# Patient Record
Sex: Female | Born: 1975
Health system: Southern US, Community
[De-identification: ages and names within clinical notes are randomized; demographics above are authoritative.]

## PROBLEM LIST (undated history)

## (undated) DIAGNOSIS — W3400XA Accidental discharge from unspecified firearms or gun, initial encounter: Secondary | ICD-10-CM

## (undated) DIAGNOSIS — M549 Dorsalgia, unspecified: Secondary | ICD-10-CM

## (undated) DIAGNOSIS — M1990 Unspecified osteoarthritis, unspecified site: Secondary | ICD-10-CM

## (undated) HISTORY — PX: CHOLECYSTECTOMY: SHX55

## (undated) HISTORY — DX: Accidental discharge from unspecified firearms or gun, initial encounter: W34.00XA

## (undated) HISTORY — DX: Dorsalgia, unspecified: M54.9

---

## 1997-03-03 ENCOUNTER — Ambulatory Visit (HOSPITAL_COMMUNITY): Admission: RE | Admit: 1997-03-03 | Discharge: 1997-03-03 | Payer: Self-pay | Admitting: *Deleted

## 1997-03-21 ENCOUNTER — Inpatient Hospital Stay (HOSPITAL_COMMUNITY): Admission: AD | Admit: 1997-03-21 | Discharge: 1997-03-21 | Payer: Self-pay | Admitting: Obstetrics

## 1997-03-23 ENCOUNTER — Inpatient Hospital Stay (HOSPITAL_COMMUNITY): Admission: AD | Admit: 1997-03-23 | Discharge: 1997-03-23 | Payer: Self-pay | Admitting: Obstetrics

## 1997-04-06 ENCOUNTER — Inpatient Hospital Stay (HOSPITAL_COMMUNITY): Admission: AD | Admit: 1997-04-06 | Discharge: 1997-04-06 | Payer: Self-pay | Admitting: Obstetrics

## 1997-05-05 ENCOUNTER — Inpatient Hospital Stay (HOSPITAL_COMMUNITY): Admission: AD | Admit: 1997-05-05 | Discharge: 1997-05-08 | Payer: Self-pay | Admitting: Obstetrics & Gynecology

## 2001-04-14 ENCOUNTER — Inpatient Hospital Stay (HOSPITAL_COMMUNITY): Admission: AD | Admit: 2001-04-14 | Discharge: 2001-04-14 | Payer: Self-pay | Admitting: *Deleted

## 2001-06-03 ENCOUNTER — Emergency Department (HOSPITAL_COMMUNITY): Admission: EM | Admit: 2001-06-03 | Discharge: 2001-06-03 | Payer: Self-pay | Admitting: *Deleted

## 2001-06-06 ENCOUNTER — Encounter: Payer: Self-pay | Admitting: Emergency Medicine

## 2001-06-06 ENCOUNTER — Emergency Department (HOSPITAL_COMMUNITY): Admission: EM | Admit: 2001-06-06 | Discharge: 2001-06-06 | Payer: Self-pay

## 2002-09-05 ENCOUNTER — Encounter: Payer: Self-pay | Admitting: Emergency Medicine

## 2002-09-05 ENCOUNTER — Emergency Department (HOSPITAL_COMMUNITY): Admission: EM | Admit: 2002-09-05 | Discharge: 2002-09-05 | Payer: Self-pay | Admitting: Emergency Medicine

## 2009-05-24 ENCOUNTER — Emergency Department (HOSPITAL_COMMUNITY): Admission: EM | Admit: 2009-05-24 | Discharge: 2009-05-24 | Payer: Self-pay | Admitting: Emergency Medicine

## 2009-10-17 ENCOUNTER — Emergency Department (HOSPITAL_COMMUNITY): Admission: EM | Admit: 2009-10-17 | Discharge: 2009-10-18 | Payer: Self-pay | Admitting: Emergency Medicine

## 2009-12-21 ENCOUNTER — Emergency Department (HOSPITAL_COMMUNITY)
Admission: EM | Admit: 2009-12-21 | Discharge: 2009-12-21 | Payer: Self-pay | Source: Home / Self Care | Admitting: Emergency Medicine

## 2010-05-29 ENCOUNTER — Emergency Department (HOSPITAL_COMMUNITY)
Admission: EM | Admit: 2010-05-29 | Discharge: 2010-05-29 | Disposition: A | Discharge status: Home / Self Care | Payer: Medicaid Other | Attending: Emergency Medicine | Admitting: Emergency Medicine

## 2010-05-29 DIAGNOSIS — M79609 Pain in unspecified limb: Secondary | ICD-10-CM | POA: Insufficient documentation

## 2010-07-31 ENCOUNTER — Ambulatory Visit (INDEPENDENT_AMBULATORY_CARE_PROVIDER_SITE_OTHER): Payer: Self-pay | Admitting: General Surgery

## 2010-09-14 ENCOUNTER — Ambulatory Visit (INDEPENDENT_AMBULATORY_CARE_PROVIDER_SITE_OTHER): Payer: Medicaid Other | Admitting: General Surgery

## 2012-06-10 ENCOUNTER — Emergency Department (HOSPITAL_COMMUNITY)
Admission: EM | Admit: 2012-06-10 | Discharge: 2012-06-10 | Disposition: A | Discharge status: Home / Self Care | Payer: No Typology Code available for payment source | Attending: Emergency Medicine | Admitting: Emergency Medicine

## 2012-06-10 ENCOUNTER — Encounter (HOSPITAL_COMMUNITY): Payer: Self-pay | Admitting: *Deleted

## 2012-06-10 DIAGNOSIS — S8990XA Unspecified injury of unspecified lower leg, initial encounter: Secondary | ICD-10-CM | POA: Insufficient documentation

## 2012-06-10 DIAGNOSIS — F17200 Nicotine dependence, unspecified, uncomplicated: Secondary | ICD-10-CM | POA: Insufficient documentation

## 2012-06-10 DIAGNOSIS — Y9389 Activity, other specified: Secondary | ICD-10-CM | POA: Insufficient documentation

## 2012-06-10 DIAGNOSIS — Y92410 Unspecified street and highway as the place of occurrence of the external cause: Secondary | ICD-10-CM | POA: Insufficient documentation

## 2012-06-10 DIAGNOSIS — S99919A Unspecified injury of unspecified ankle, initial encounter: Secondary | ICD-10-CM | POA: Insufficient documentation

## 2012-06-10 DIAGNOSIS — M25561 Pain in right knee: Secondary | ICD-10-CM

## 2012-06-10 MED ORDER — TRAMADOL HCL 50 MG PO TABS
50.0000 mg | ORAL_TABLET | Freq: Once | ORAL | Status: AC
  Administered 2012-06-10: 50 mg via ORAL
  Filled 2012-06-10: qty 1

## 2012-06-10 MED ORDER — TRAMADOL HCL 50 MG PO TABS: 50.0000 mg | ORAL_TABLET | Freq: Four times a day (QID) | ORAL | Status: DC | PRN
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
## 2012-06-10 NOTE — ED Provider Notes (Signed)
Medical screening examination/treatment/procedure(s) were performed by non-physician practitioner and as supervising physician I was immediately available for consultation/collaboration.   Jasimine Simms H Gavinn Collard, MD 06/10/12 2328 

## 2012-07-24 ENCOUNTER — Inpatient Hospital Stay (HOSPITAL_COMMUNITY)
Admission: EM | Admit: 2012-07-24 | Discharge: 2012-07-26 | DRG: 872 | Disposition: A | Discharge status: Home / Self Care | Payer: Medicaid Other | Attending: Internal Medicine | Admitting: Internal Medicine

## 2012-07-24 ENCOUNTER — Emergency Department (HOSPITAL_COMMUNITY): Payer: Medicaid Other

## 2012-07-24 ENCOUNTER — Encounter (HOSPITAL_COMMUNITY): Payer: Self-pay | Admitting: *Deleted

## 2012-07-24 DIAGNOSIS — F1210 Cannabis abuse, uncomplicated: Secondary | ICD-10-CM | POA: Diagnosis present

## 2012-07-24 DIAGNOSIS — D649 Anemia, unspecified: Secondary | ICD-10-CM | POA: Diagnosis present

## 2012-07-24 DIAGNOSIS — D72829 Elevated white blood cell count, unspecified: Secondary | ICD-10-CM | POA: Diagnosis present

## 2012-07-24 DIAGNOSIS — N12 Tubulo-interstitial nephritis, not specified as acute or chronic: Secondary | ICD-10-CM

## 2012-07-24 DIAGNOSIS — N10 Acute tubulo-interstitial nephritis: Secondary | ICD-10-CM | POA: Diagnosis present

## 2012-07-24 DIAGNOSIS — A419 Sepsis, unspecified organism: Principal | ICD-10-CM | POA: Diagnosis present

## 2012-07-24 DIAGNOSIS — E876 Hypokalemia: Secondary | ICD-10-CM | POA: Diagnosis present

## 2012-07-24 DIAGNOSIS — D509 Iron deficiency anemia, unspecified: Secondary | ICD-10-CM | POA: Diagnosis present

## 2012-07-24 DIAGNOSIS — F17200 Nicotine dependence, unspecified, uncomplicated: Secondary | ICD-10-CM | POA: Diagnosis present

## 2012-07-24 LAB — CBC WITH DIFFERENTIAL/PLATELET
Basophils Absolute: 0 10*3/uL (ref 0.0–0.1)
Basophils Absolute: 0 10*3/uL (ref 0.0–0.1)
Basophils Relative: 0 % (ref 0–1)
Eosinophils Absolute: 0.1 10*3/uL (ref 0.0–0.7)
Eosinophils Relative: 0 % (ref 0–5)
Eosinophils Relative: 1 % (ref 0–5)
HCT: 31.7 % — ABNORMAL LOW (ref 36.0–46.0)
Lymphocytes Relative: 13 % (ref 12–46)
Lymphs Abs: 2 10*3/uL (ref 0.7–4.0)
MCH: 27.2 pg (ref 26.0–34.0)
MCHC: 33.4 g/dL (ref 30.0–36.0)
MCV: 81.8 fL (ref 78.0–100.0)
MCV: 81.5 fL (ref 78.0–100.0)
Monocytes Absolute: 1.9 10*3/uL — ABNORMAL HIGH (ref 0.1–1.0)
Neutro Abs: 10.8 10*3/uL — ABNORMAL HIGH (ref 1.7–7.7)
Platelets: 273 10*3/uL (ref 150–400)
Platelets: 241 10*3/uL (ref 150–400)
RBC: 4.35 MIL/uL (ref 3.87–5.11)
RDW: 13.7 % (ref 11.5–15.5)
RDW: 13.7 % (ref 11.5–15.5)
WBC: 14.7 10*3/uL — ABNORMAL HIGH (ref 4.0–10.5)

## 2012-07-24 LAB — URINALYSIS, ROUTINE W REFLEX MICROSCOPIC
Glucose, UA: NEGATIVE mg/dL
Ketones, ur: NEGATIVE mg/dL
Nitrite: POSITIVE — AB
Specific Gravity, Urine: 1.013 (ref 1.005–1.030)
pH: 5.5 (ref 5.0–8.0)

## 2012-07-24 LAB — POCT I-STAT, CHEM 8
BUN: 5 mg/dL — ABNORMAL LOW (ref 6–23)
Calcium, Ion: 1.1 mmol/L — ABNORMAL LOW (ref 1.12–1.23)
Chloride: 103 mEq/L (ref 96–112)
HCT: 36 % (ref 36.0–46.0)
Potassium: 2.9 mEq/L — ABNORMAL LOW (ref 3.5–5.1)
Sodium: 140 mEq/L (ref 135–145)

## 2012-07-24 LAB — COMPREHENSIVE METABOLIC PANEL
ALT: 53 U/L — ABNORMAL HIGH (ref 0–35)
AST: 42 U/L — ABNORMAL HIGH (ref 0–37)
CO2: 24 mEq/L (ref 19–32)
Chloride: 103 mEq/L (ref 96–112)
GFR calc Af Amer: >90 mL/min (ref >90)
GFR calc non Af Amer: >90 mL/min (ref >90)
Glucose, Bld: 135 mg/dL — ABNORMAL HIGH (ref 70–99)
Sodium: 137 mEq/L (ref 135–145)
Total Bilirubin: 0.3 mg/dL (ref 0.3–1.2)

## 2012-07-24 LAB — URINE MICROSCOPIC-ADD ON

## 2012-07-24 LAB — POCT PREGNANCY, URINE: Preg Test, Ur: NEGATIVE

## 2012-07-24 MED ORDER — ACETAMINOPHEN 325 MG PO TABS: 650.0000 mg | ORAL_TABLET | Freq: Four times a day (QID) | ORAL | Status: DC | PRN

## 2012-07-24 MED ORDER — ONDANSETRON HCL 4 MG/2ML IJ SOLN
4.0000 mg | Freq: Once | INTRAMUSCULAR | Status: AC
  Administered 2012-07-24: 4 mg via INTRAVENOUS
  Filled 2012-07-24: qty 2

## 2012-07-24 MED ORDER — MORPHINE SULFATE 4 MG/ML IJ SOLN
4.0000 mg | Freq: Once | INTRAMUSCULAR | Status: AC
  Administered 2012-07-24: 4 mg via INTRAVENOUS
  Filled 2012-07-24: qty 1

## 2012-07-24 MED ORDER — ACETAMINOPHEN 650 MG RE SUPP: 650.0000 mg | Freq: Four times a day (QID) | RECTAL | Status: DC | PRN

## 2012-07-24 MED ORDER — SODIUM CHLORIDE 0.9 % IV SOLN
INTRAVENOUS | Status: DC
  Administered 2012-07-24 – 2012-07-26 (×4): via INTRAVENOUS

## 2012-07-24 MED ORDER — DEXTROSE 5 % IV SOLN
1.0000 g | INTRAVENOUS | Status: DC
  Administered 2012-07-25 – 2012-07-26 (×2): 1 g via INTRAVENOUS
  Filled 2012-07-24 (×2): qty 10

## 2012-07-24 MED ORDER — ONDANSETRON HCL 4 MG PO TABS: 4.0000 mg | ORAL_TABLET | Freq: Four times a day (QID) | ORAL | Status: DC | PRN

## 2012-07-24 MED ORDER — POTASSIUM CHLORIDE 10 MEQ/100ML IV SOLN
10.0000 meq | Freq: Once | INTRAVENOUS | Status: AC
  Administered 2012-07-24: 10 meq via INTRAVENOUS
  Filled 2012-07-24: qty 100

## 2012-07-24 MED ORDER — SODIUM CHLORIDE 0.9 % IV BOLUS (SEPSIS)
1000.0000 mL | Freq: Once | INTRAVENOUS | Status: AC
  Administered 2012-07-24: 1000 mL via INTRAVENOUS

## 2012-07-24 MED ORDER — POTASSIUM CHLORIDE CRYS ER 20 MEQ PO TBCR
40.0000 meq | EXTENDED_RELEASE_TABLET | Freq: Once | ORAL | Status: AC
  Administered 2012-07-24: 40 meq via ORAL
  Filled 2012-07-24: qty 2

## 2012-07-24 MED ORDER — ACETAMINOPHEN 325 MG PO TABS
650.0000 mg | ORAL_TABLET | Freq: Once | ORAL | Status: AC
  Administered 2012-07-24: 650 mg via ORAL
  Filled 2012-07-24: qty 2

## 2012-07-24 MED ORDER — DEXTROSE 5 % IV SOLN
1.0000 g | Freq: Once | INTRAVENOUS | Status: AC
  Administered 2012-07-24: 1 g via INTRAVENOUS
  Filled 2012-07-24: qty 10

## 2012-07-24 MED ORDER — MORPHINE SULFATE 4 MG/ML IJ SOLN
4.0000 mg | INTRAMUSCULAR | Status: DC | PRN
  Administered 2012-07-24 – 2012-07-26 (×5): 4 mg via INTRAVENOUS
  Filled 2012-07-24 (×5): qty 1

## 2012-07-24 MED ORDER — HYDROCODONE-ACETAMINOPHEN 5-325 MG PO TABS
1.0000 | ORAL_TABLET | ORAL | Status: DC | PRN
  Administered 2012-07-24 – 2012-07-26 (×4): 2 via ORAL
  Filled 2012-07-24: qty 2
  Filled 2012-07-24: qty 1
  Filled 2012-07-24 (×2): qty 2
  Filled 2012-07-24: qty 1

## 2012-07-24 MED ORDER — ONDANSETRON HCL 4 MG/2ML IJ SOLN: 4.0000 mg | Freq: Four times a day (QID) | INTRAMUSCULAR | Status: DC | PRN

## 2012-07-24 MED ORDER — BIOTENE DRY MOUTH MT LIQD
15.0000 mL | Freq: Two times a day (BID) | OROMUCOSAL | Status: DC
  Administered 2012-07-25 (×2): 15 mL via OROMUCOSAL

## 2012-07-24 NOTE — H&P (Addendum)
[facility] History and Physical  Carolyn Summers ZOX:[id number] DOB: [birth year] DOA: 07/24/2012  Referring physician: ER physician PCP: Provider Not In System   Chief Complaint: weakness, fatigue   HPI:  37 year old female with no significant past medical history who presented to [facility] ED with complaints of generalzied myalgia, fatigue and headaches for 1 week prior to this admission. Patient also reported poor appetite, nausea and vomiting over past few days. Patient had no complaints of abdominal pain but did report low back pain on and off for several days. No fever or chills. No dysuria or hematuria. No constipation or diarrhea. No chest pain, no shortness of breath or palpitations. No lightheadedness or loss of consciousness. In ED, patient is hemodynamically stable with BP 135/65, HR 85 and T 98.9 F. CBC revealed leukocytosis of 14.7 and hemoglobin of 11.3. BMP revealed hypokalemia of 2.9 which was repleted with potasium 40 meq x once PO and 10 meq IV once. CXR showed no acute cardiopulmonary process. Urinalysis was significant for large leuocyte esterases and positive nitrites.   Assessment and Plan:  Principal Problem:   UTI (lower urinary tract infection)/ Pyelonephritis - rocephin 1 gm Q 24 hours IV - follow up urine culture results - analgesia with morphine 4 mg Q 2 hours IV PRN Active Problems:   Leukocytosis - secondary to UTI and pyelonephritis - management as above   Anemia - hemoglobin stable at 11.3 - no indications for transfusion   Hypokalemia - repleted in ED - follow up BMP in am  Manson Passey [facility] [phone]  Review of Systems:  Constitutional: Negative for fever, chills and malaise/fatigue. Negative for diaphoresis.  HENT: Negative for hearing loss, ear pain, nosebleeds, congestion, sore throat, neck pain, tinnitus and ear discharge.   Eyes: Negative for blurred vision, double vision, photophobia, pain, discharge and redness.  Respiratory: Negative for  cough, hemoptysis, sputum production, shortness of breath, wheezing and stridor.   Cardiovascular: Negative for chest pain, palpitations, orthopnea, claudication and leg swelling.  Gastrointestinal: Negative for nausea, vomiting and abdominal pain. Negative for heartburn, constipation, blood in stool and melena.  Genitourinary: Negative for dysuria, urgency, frequency, hematuria and flank pain.  Musculoskeletal: Negative for myalgias, back pain, joint pain and falls.  Skin: Negative for itching and rash.  Neurological: Negative for dizziness and weakness. Negative for tingling, tremors, sensory change, speech change, focal weakness, loss of consciousness and headaches.  Endo/Heme/Allergies: Negative for environmental allergies and polydipsia. Does not bruise/bleed easily.  Psychiatric/Behavioral: Negative for suicidal ideas. The patient is not nervous/anxious.      History reviewed. No pertinent past medical history. Past Surgical History  Procedure Laterality Date  . Cholecystectomy      2004   Social History:  reports that she has been smoking Cigarettes.  She has been smoking about 1.00 pack per day. She has never used smokeless tobacco. She reports that  drinks alcohol. She reports that she uses illicit drugs (Marijuana).  No Known Allergies  Family History: Family medical history significant for HTN   Prior to Admission medications   Medication Sig Start Date End Date Taking? Authorizing Provider  ibuprofen (ADVIL,MOTRIN) 200 MG tablet Take 400 mg by mouth every 6 (six) hours as needed for pain.   Yes Historical Provider, MD   Physical Exam: Filed Vitals:   07/24/12 1415 07/24/12 1931  BP: 138/75 135/65  Pulse: 95 85  Temp: 98.9 F (37.2 C)   TempSrc: Oral   Resp: 18 16  Height: 5\' 10"  (  1.778 m)   Weight: 92.987 kg (205 lb)   SpO2: 100% 100%    Physical Exam  Constitutional: Appears well-developed and well-nourished. No distress.  HENT: Normocephalic. External right  and left ear normal. Oropharynx is clear and moist.  Eyes: Conjunctivae and EOM are normal. PERRLA, no scleral icterus.  Neck: Normal ROM. Neck supple. No JVD. No tracheal deviation. No thyromegaly.  CVS: RRR, S1/S2 +, no murmurs, no gallops, no carotid bruit.  Pulmonary: Effort and breath sounds normal, no stridor, rhonchi, wheezes, rales.  Abdominal: Soft. BS +,  no distension, tenderness, rebound or guarding.  Musculoskeletal: Normal range of motion. No edema and no tenderness.  Lymphadenopathy: No lymphadenopathy noted, cervical, inguinal. Neuro: Alert. Normal reflexes, muscle tone coordination. No cranial nerve deficit. Skin: Skin is warm and dry. No rash noted. Not diaphoretic. No erythema. No pallor.  Psychiatric: Normal mood and affect. Behavior, judgment, thought content normal.   Labs on Admission:  Basic Metabolic Panel:  Recent Labs Lab 07/24/12 1552  NA 140  K 2.9*  CL 103  GLUCOSE 102*  BUN 5*  CREATININE 0.90   Liver Function Tests: No results found for this basename: AST, ALT, ALKPHOS, BILITOT, PROT, ALBUMIN,  in the last 168 hours No results found for this basename: LIPASE, AMYLASE,  in the last 168 hours No results found for this basename: AMMONIA,  in the last 168 hours CBC:  Recent Labs Lab 07/24/12 1543 07/24/12 1552  WBC 14.7*  --   NEUTROABS 10.8*  --   HGB 11.3* 12.2  HCT 35.6* 36.0  MCV 81.8  --   PLT 273  --    Cardiac Enzymes: No results found for this basename: CKTOTAL, CKMB, CKMBINDEX, TROPONINI,  in the last 168 hours BNP: No components found with this basename: POCBNP,  CBG: No results found for this basename: GLUCAP,  in the last 168 hours  Radiological Exams on Admission: Dg Chest 2 View  07/24/2012   *RADIOLOGY REPORT*  Clinical Data: Cough.  CHEST - 2 VIEW  Comparison: None.  Findings: Two views of the chest were obtained.  Lungs are clear without airspace disease or edema. Heart and mediastinum are within normal limits.  The  trachea is midline.  Bony thorax is intact.  IMPRESSION: No acute cardiopulmonary disease.   Original Report Authenticated By: Richarda Overlie, M.D.    EKG: Normal sinus rhythm, no ST/T wave changes  Code Status: Full Family Communication: Pt at bedside Disposition Plan: Admit for further evaluation  Manson Passey, MD  [facility] Pager [phone]  If 7PM-7AM, please contact night-coverage [email or web address] Password [facility] 07/24/2012, 8:23 PM

## 2012-07-24 NOTE — ED Provider Notes (Signed)
History    CSN: [id number] Arrival date & time 07/24/12  1409  First MD Initiated Contact with Patient 07/24/12 1444     Chief Complaint  Patient presents with  . Sore Throat  . Generalized Body Aches  . Emesis   (Consider location/radiation/quality/duration/timing/severity/associated sxs/prior Treatment) HPI  37 year old female with a past surgical history of cholecystectomy presents with URI symptoms. Patient reports for the past 4 days she has gradual onset of generalized bodyaches, headache, sore throat, lack of appetite, and feeling weak.  Cough is nonproductive. Headaches describes a throbbing sensation to forehead.  She has had nausea and has vomited 3 or 4 times the past 2 days. Vomitus is nonbloody nonbilious. She has tried over-the-counter ibuprofen with minimal relief. Patient feels dehydrated. Patient otherwise denies fever but endorsed chills. Denies neck stiffness, sneezing, runny nose, ear pain, chest pain, shortness of breath, back pain, dysuria, hematuria, hematochezia, or melena. Patient denies rash. Last menstrual period was June 9.  History reviewed. No pertinent past medical history. Past Surgical History  Procedure Laterality Date  . Cholecystectomy      2004   History reviewed. No pertinent family history. History  Substance Use Topics  . Smoking status: Current Every Day Smoker -- 1.00 packs/day    Types: Cigarettes  . Smokeless tobacco: Never Used  . Alcohol Use: Yes     Comment: twice monthly   OB History   Grav Para Term Preterm Abortions TAB SAB Ect Mult Living                 Review of Systems  All other systems reviewed and are negative.    Allergies  Review of patient's allergies indicates no known allergies.  Home Medications   Current Outpatient Rx  Name  Route  Sig  Dispense  Refill  . ibuprofen (ADVIL,MOTRIN) 200 MG tablet   Oral   Take 400 mg by mouth every 6 (six) hours as needed for pain.          BP 138/75  Pulse 95   Temp(Src) 98.9 F (37.2 C) (Oral)  Resp 18  Ht 5\' 10"  (1.778 m)  Wt 205 lb (92.987 kg)  BMI 29.41 kg/m2  SpO2 100%  LMP 06/29/2012 Physical Exam  Nursing note and vitals reviewed. Constitutional: She is oriented to person, place, and time. She appears well-developed and well-nourished. No distress.  Awake, alert, nontoxic appearance  HENT:  Head: Atraumatic.  Right Ear: External ear normal.  Left Ear: External ear normal.  Mouth/Throat: Oropharynx is clear and moist.   uvula is midline, mild posterior oropharyngeal erythema with no tonsillar enlargement or exudates  Eyes: Conjunctivae are normal. Right eye exhibits no discharge. Left eye exhibits no discharge.  Neck: Neck supple.  No nuchal rigidity   Cardiovascular: Normal rate, regular rhythm and intact distal pulses.   Pulmonary/Chest: Effort normal. No respiratory distress. She exhibits no tenderness.  Abdominal: Soft. There is no tenderness. There is no rebound.  Musculoskeletal: She exhibits no tenderness.  ROM appears intact, no obvious focal weakness  Neurological: She is alert and oriented to person, place, and time.  Mental status and motor strength appears intact  Skin: No rash noted.  Psychiatric: She has a normal mood and affect.    ED Course  Procedures (including critical care time)   Date: 07/24/2012  Rate: 81  Rhythm: normal sinus rhythm  QRS Axis: normal  Intervals: normal  ST/T Wave abnormalities: normal  Conduction Disutrbances: none  Narrative Interpretation: no  U-wave  Old EKG Reviewed: none for comparison    3:21 PM Patient with vague symptoms suggestive of viral syndrome. Strep test is negative for strep throat. She is afebrile with stable normal vital signs. Abdomen is soft and nontender. She does not appears to be dehydrated. Patient appears nontoxic. Will check basic labs, UA, pregnancy test, and we'll give IV fluid and treat her symptoms.  6:38 PM Patient with urinalysis which show  evidence of urinary tract infection. Does have mild low back pain but no CVA tenderness. She has no abdominal pain. She is able to tolerates by mouth. She is afebrile. Will treat patient for pyelonephritis with Rocephin IV here. Otherwise patient prefers to be discharged for further management of her condition versus inpatient care if pt unable to tolerates PO.    7:27 PM Pt unable to tolerates PO, felt worse. Will have pt admitted for further care of her pyelonephritis.    Labs Reviewed  CBC WITH DIFFERENTIAL - Abnormal; Notable for the following:    WBC 14.7 (*)    Hemoglobin 11.3 (*)    HCT 35.6 (*)    Neutro Abs 10.8 (*)    Monocytes Absolute 1.8 (*)    All other components within normal limits  URINALYSIS, ROUTINE W REFLEX MICROSCOPIC - Abnormal; Notable for the following:    APPearance CLOUDY (*)    Hgb urine dipstick LARGE (*)    Urobilinogen, UA 2.0 (*)    Nitrite POSITIVE (*)    Leukocytes, UA LARGE (*)    All other components within normal limits  URINE MICROSCOPIC-ADD ON - Abnormal; Notable for the following:    Bacteria, UA MANY (*)    All other components within normal limits  POCT I-STAT, CHEM 8 - Abnormal; Notable for the following:    Potassium 2.9 (*)    BUN 5 (*)    Glucose, Bld 102 (*)    Calcium, Ion 1.10 (*)    All other components within normal limits  RAPID STREP SCREEN  CULTURE, GROUP A STREP  URINE CULTURE   Dg Chest 2 View  07/24/2012   *RADIOLOGY REPORT*  Clinical Data: Cough.  CHEST - 2 VIEW  Comparison: None.  Findings: Two views of the chest were obtained.  Lungs are clear without airspace disease or edema. Heart and mediastinum are within normal limits.  The trachea is midline.  Bony thorax is intact.  IMPRESSION: No acute cardiopulmonary disease.   Original Report Authenticated By: Richarda Overlie, M.D.   1. Pyelonephritis   2. Hypokalemia     MDM  BP 135/65  Pulse 85  Temp(Src) 98.9 F (37.2 C) (Oral)  Resp 16  Ht 5\' 10"  (1.778 m)  Wt 205 lb  (92.987 kg)  BMI 29.41 kg/m2  SpO2 100%  LMP 06/29/2012  I have reviewed nursing notes and vital signs. I personally reviewed the imaging tests through PACS system  I reviewed available ER/hospitalization records thought the EMR   Fayrene Helper, [place] 07/24/12 1958

## 2012-07-24 NOTE — ED Notes (Signed)
Report called to floor

## 2012-07-24 NOTE — ED Notes (Signed)
Pt c/o generalized body aches, Headache, sore throat and reports poor PO intake since Wednesday. Reports symptoms started on Wednesday with general weakness and nausea. Denies abd pain at present.

## 2012-07-24 NOTE — ED Notes (Signed)
Patient ate only several bites of dinner.  She said she felt too sick to eat.

## 2012-07-24 NOTE — ED Provider Notes (Signed)
History/physical exam/procedure(s) were performed by non-physician practitioner and as supervising physician I was immediately available for consultation/collaboration. I have reviewed all notes and am in agreement with care and plan.   Jaquon Gingerich S Aleen Marston, MD 07/24/12 2231 

## 2012-07-25 DIAGNOSIS — A419 Sepsis, unspecified organism: Secondary | ICD-10-CM | POA: Diagnosis present

## 2012-07-25 LAB — CBC
HCT: 33 % — ABNORMAL LOW (ref 36.0–46.0)
Hemoglobin: 10.8 g/dL — ABNORMAL LOW (ref 12.0–15.0)
RDW: 13.7 % (ref 11.5–15.5)
WBC: 13.1 10*3/uL — ABNORMAL HIGH (ref 4.0–10.5)

## 2012-07-25 LAB — COMPREHENSIVE METABOLIC PANEL
ALT: 48 U/L — ABNORMAL HIGH (ref 0–35)
BUN: 3 mg/dL — ABNORMAL LOW (ref 6–23)
CO2: 26 mEq/L (ref 19–32)
Calcium: 8.4 mg/dL (ref 8.4–10.5)
Creatinine, Ser: 0.7 mg/dL (ref 0.50–1.10)
GFR calc Af Amer: >90 mL/min (ref >90)
GFR calc non Af Amer: >90 mL/min (ref >90)
Glucose, Bld: 113 mg/dL — ABNORMAL HIGH (ref 70–99)
Sodium: 137 mEq/L (ref 135–145)

## 2012-07-25 LAB — GLUCOSE, CAPILLARY: Glucose-Capillary: 123 mg/dL — ABNORMAL HIGH (ref 70–99)

## 2012-07-25 MED ORDER — KETOROLAC TROMETHAMINE 30 MG/ML IJ SOLN
30.0000 mg | Freq: Four times a day (QID) | INTRAMUSCULAR | Status: DC | PRN
  Administered 2012-07-25 – 2012-07-26 (×3): 30 mg via INTRAVENOUS
  Filled 2012-07-25 (×3): qty 1

## 2012-07-25 MED ORDER — POTASSIUM CHLORIDE 10 MEQ/100ML IV SOLN
10.0000 meq | INTRAVENOUS | Status: DC
  Administered 2012-07-25: 10 meq via INTRAVENOUS
  Filled 2012-07-25 (×4): qty 100

## 2012-07-25 MED ORDER — ZOLPIDEM TARTRATE 5 MG PO TABS
5.0000 mg | ORAL_TABLET | Freq: Every day | ORAL | Status: DC
  Administered 2012-07-25 (×2): 5 mg via ORAL
  Filled 2012-07-25 (×2): qty 1

## 2012-07-25 MED ORDER — POTASSIUM CHLORIDE CRYS ER 20 MEQ PO TBCR
20.0000 meq | EXTENDED_RELEASE_TABLET | Freq: Two times a day (BID) | ORAL | Status: DC
  Administered 2012-07-25 – 2012-07-26 (×3): 20 meq via ORAL
  Filled 2012-07-25 (×4): qty 1

## 2012-07-25 NOTE — Progress Notes (Signed)
CRITICAL VALUE ALERT  Critical value received:  K - 2.7  Date of notification:  07/24/2012  Time of notification:  2353  Critical value read back: yes  Nurse who received alert:  Cynda Familia, RN  MD notified (1st page):  Lenny Pastel, NP  Time of first page:  2355  MD notified (2nd page):  Time of second page:  Responding MD:  Lenny Pastel, NP  Time MD responded:  07/25/12 0001  4 runs of IV KCl ordered.

## 2012-07-25 NOTE — Progress Notes (Signed)
[facility] PROGRESS NOTE  Carolyn Summers ZOX:[id number] DOB: [birth year] DOA: 07/24/2012 PCP: Provider Not In System  Brief narrative: Carolyn Summers is an 37 y.o. female with no significant PMH who was admitted on 07/24/2012 with acute pyelonephritis and hypokalemia.  Assessment/Plan: Principal Problem:   Sepsis secondary to pyelonephritis with leukocytosis -The patient needs SIRS criteria with heart rate greater than 90 and WBC greater than 12,000. She has a source of infection and therefore meets the criteria for sepsis. -Continue empiric Rocephin. -Followup urine cultures. Narrow antibiotics based on culture data. Active Problems:   Normocytic anemia -Likely secondary to iron deficiency from chronic menstrual losses given age.   Hypokalemia -Supplement orally. -magnesium WNL.   Code Status: Full. Family Communication: No family at bedside. Disposition Plan: Home when stable.   Medical Consultants:  None.  Other Consultants:  None.  Anti-infectives:  Rocephin 07/24/2012--->  HPI/Subjective: Carolyn Summers is still feeling nauseated.  No active vomiting.  Had diarrhea yesterday, none so far today.  Has back pain.  Objective: Filed Vitals:   07/24/12 1931 07/24/12 2055 07/25/12 0458 07/25/12 0636  BP: 135/65 124/68 129/69   Pulse: 85 82 100   Temp:  99.9 F (37.7 C) 99.8 F (37.7 C)   TempSrc:  Oral Oral   Resp: 16 20 16    Height:  5\' 10"  (1.778 m)    Weight:  92.3 kg (203 lb 7.8 oz)  96 kg (211 lb 10.3 oz)  SpO2: 100% 100% 100%     Intake/Output Summary (Last 24 hours) at 07/25/12 0737 Last data filed at 07/25/12 0600  Gross per 24 hour  Intake    675 ml  Output    450 ml  Net    225 ml    Exam: Gen:  NAD Cardiovascular:  RRR, No M/R/G Respiratory:  Lungs CTAB Gastrointestinal:  Abdomen soft, NT/ND, + BS Extremities:  No C/E/C  Data Reviewed: Basic Metabolic Panel:  Recent Labs Lab 07/24/12 1552 07/24/12 2230 07/25/12 0352  NA  140 137 137  K 2.9* 2.7* 3.0*  CL 103 103 102  CO2  --  24 26  GLUCOSE 102* 135* 113*  BUN 5* 4* 3*  CREATININE 0.90 0.71 0.70  CALCIUM  --  8.1* 8.4  MG  --  1.8  --   PHOS  --  2.8  --    GFR Estimated Creatinine Clearance: 120.8 ml/min (by C-G formula based on Cr of 0.7). Liver Function Tests:  Recent Labs Lab 07/24/12 2230 07/25/12 0352  AST 42* 31  ALT 53* 48*  ALKPHOS 95 107  BILITOT 0.3 0.4  PROT 6.8 6.9  ALBUMIN 2.8* 2.9*   Coagulation profile  Recent Labs Lab 07/24/12 2230  INR 1.20    CBC:  Recent Labs Lab 07/24/12 1543 07/24/12 1552 07/24/12 2230 07/25/12 0352  WBC 14.7*  --  14.4* 13.1*  NEUTROABS 10.8*  --  8.9*  --   HGB 11.3* 12.2 10.6* 10.8*  HCT 35.6* 36.0 31.7* 33.0*  MCV 81.8  --  81.5 81.9  PLT 273  --  241 241   Microbiology Recent Results (from the past 240 hour(s))  RAPID STREP SCREEN     Status: None   Collection Time    07/24/12  2:49 PM      Result Value Range Status   Streptococcus, Group A Screen (Direct) NEGATIVE  NEGATIVE Final   Comment: (NOTE)     A Rapid Antigen test may result negative if  the antigen level in the     sample is below the detection level of this test. The FDA has not     cleared this test as a stand-alone test therefore the rapid antigen     negative result has reflexed to a Group A Strep culture.     Procedures and Diagnostic Studies: Dg Chest 2 View  07/24/2012   *RADIOLOGY REPORT*  Clinical Data: Cough.  CHEST - 2 VIEW  Comparison: None.  Findings: Two views of the chest were obtained.  Lungs are clear without airspace disease or edema. Heart and mediastinum are within normal limits.  The trachea is midline.  Bony thorax is intact.  IMPRESSION: No acute cardiopulmonary disease.   Original Report Authenticated By: Richarda Overlie, M.D.    Scheduled Meds: . antiseptic oral rinse  15 mL Mouth Rinse BID  . cefTRIAXone (ROCEPHIN)  IV  1 g Intravenous Q24H  . zolpidem  5 mg Oral QHS   Continuous  Infusions: . sodium chloride 75 mL/hr at 07/25/12 0359    Time spent: 25 minutes.   LOS: 1 day   RAMA,CHRISTINA  [facility] Pager [phone].   *Please note that the hospitalists switch teams on Wednesdays. Please call the flow manager at [phone] if you are having difficulty reaching the hospitalist taking care of this patient as she can update you and provide the most up-to-date pager number of provider caring for the patient. If 8PM-8AM, please contact night-coverage at [email or web address], password [email or web address]  07/25/2012, 7:37 AM

## 2012-07-25 NOTE — Progress Notes (Signed)
Dr. Elisabeth Pigeon in with pt at this time. Order given to stop 1st KCl run due to pt's inability to tolerate it. Pt had previously received 1 run of IV potassium and of PO potassium in ED. Orders for 4 K runs have been subsequently d/c'ed.

## 2012-07-26 LAB — CBC
MCV: 81.9 fL (ref 78.0–100.0)
Platelets: 267 10*3/uL (ref 150–400)
RBC: 3.71 MIL/uL — ABNORMAL LOW (ref 3.87–5.11)
WBC: 12.5 10*3/uL — ABNORMAL HIGH (ref 4.0–10.5)

## 2012-07-26 LAB — BASIC METABOLIC PANEL
CO2: 27 mEq/L (ref 19–32)
Calcium: 8.6 mg/dL (ref 8.4–10.5)
Chloride: 104 mEq/L (ref 96–112)
GFR calc Af Amer: >90 mL/min (ref >90)
Sodium: 137 mEq/L (ref 135–145)

## 2012-07-26 LAB — CULTURE, GROUP A STREP

## 2012-07-26 LAB — URINE CULTURE

## 2012-07-26 MED ORDER — CIPROFLOXACIN HCL 500 MG PO TABS: 500.0000 mg | ORAL_TABLET | Freq: Two times a day (BID) | ORAL | Status: DC

## 2012-07-26 MED ORDER — HYDROCODONE-ACETAMINOPHEN 5-325 MG PO TABS: 1.0000 | ORAL_TABLET | ORAL | Status: DC | PRN

## 2012-07-26 NOTE — Discharge Summary (Signed)
Physician Discharge Summary  Carolyn Summers OZH:[id number] DOB: 1975/03/12 DOA: 07/24/2012  PCP: Provider Not In System  Admit date: 07/24/2012 Discharge date: 07/26/2012  Recommendations for Outpatient Follow-up:  1. F/U at [facility] and [facility] as needed.  Discharge Diagnoses:  Principal Problem:    Sepsis secondary to pyelonephritis Active Problems:    Acute pyelonephritis    Leukocytosis    Normocytic anemia    Hypokalemia   Discharge Condition: Improved.  Diet recommendation: Regular.  History of present illness:  Carolyn Summers is an 37 y.o. female with no significant PMH who was admitted on 07/24/2012 with acute pyelonephritis and hypokalemia.  Hospital Course by problem:  Principal Problem:  Sepsis secondary to pyelonephritis with leukocytosis  -The patient met SIRS criteria with heart rate greater than 90 and WBC greater than 12,000. She had a source of infection and therefore met the criteria for sepsis.  -She was treated with empiric Rocephin. Culture data subsequently grew E. Coli, pan-sensitive. -D/C home on 7 days of PO Cipro. -Instructed to F/U with a PCP or return to the ER for non-resolution of symptoms. Active Problems:  Normocytic anemia  -Likely secondary to iron deficiency from chronic menstrual losses given age.  Hypokalemia  -Corrected with oral supplementation.  -magnesium WNL.  Procedures:  None.  Consultations:  None.  Discharge Exam: Filed Vitals:   07/26/12 1400  BP: 125/76  Pulse: 63  Temp: 98.1 F (36.7 C)  Resp: 18   Filed Vitals:   07/25/12 1406 07/25/12 2128 07/26/12 0426 07/26/12 1400  BP: 129/72 126/69 138/95 125/76  Pulse: 85 81 92 63  Temp: 98.6 F (37 C) 99 F (37.2 C) 99.2 F (37.3 C) 98.1 F (36.7 C)  TempSrc: Oral Oral Oral Oral  Resp: 18 18 18 18   Height:      Weight:      SpO2: 100% 100% 100% 100%    Gen:  NAD Cardiovascular:  RRR, No M/R/G Respiratory: Lungs  CTAB Gastrointestinal: Abdomen soft, NT/ND with normal active bowel sounds. Extremities: No C/E/C   Discharge Instructions  Discharge Orders   Future Orders Complete By Expires     Activity as tolerated - No restrictions  As directed     Call MD for:  persistant nausea and vomiting  As directed     Call MD for:  severe uncontrolled pain  As directed     Call MD for:  temperature >100.4  As directed     Call MD for:  As directed     Scheduling Instructions:      Frequent diarrhea    Diet general  As directed     Discharge instructions  As directed     Comments:      You were cared for by Dr. Hillery Aldo  (a hospitalist) during your hospital stay. If you have any questions about your discharge medications or the care you received while you were in the hospital after you are discharged, you can call the unit and ask to speak with the hospitalist on call if the hospitalist that took care of you is not available. Once you are discharged, your primary care physician will handle any further medical issues. Please note that NO REFILLS for any discharge medications will be authorized once you are discharged, as it is imperative that you return to your primary care physician (or establish a relationship with a primary care physician if you do not have one) for your aftercare needs so that they  can reassess your need for medications and monitor your lab values.  Any outstanding tests can be reviewed by your PCP at your follow up visit.  It is also important to review any medicine changes with your PCP.  Please bring these d/c instructions with you to your next visit so your physician can review these changes with you.  If you do not have a primary care physician, you can call [phone] for a physician referral.  It is highly recommended that you obtain a PCP for hospital follow up.        Medication List         ciprofloxacin 500 MG tablet  Commonly known as:  CIPRO  Take 1 tablet (500 mg total)  by mouth 2 (two) times daily.     HYDROcodone-acetaminophen 5-325 MG per tablet  Commonly known as:  NORCO/VICODIN  Take 1-2 tablets by mouth every 4 (four) hours as needed.     ibuprofen 200 MG tablet  Commonly known as:  ADVIL,MOTRIN  Take 400 mg by mouth every 6 (six) hours as needed for pain.           Follow-up Information   Schedule an appointment as soon as possible for a visit with [facility]    . (As needed)    Contact information:   [address] [place] [address]        The results of significant diagnostics from this hospitalization (including imaging, microbiology, ancillary and laboratory) are listed below for reference.    Significant Diagnostic Studies: Dg Chest 2 View  07/24/2012   *RADIOLOGY REPORT*  Clinical Data: Cough.  CHEST - 2 VIEW  Comparison: None.  Findings: Two views of the chest were obtained.  Lungs are clear without airspace disease or edema. Heart and mediastinum are within normal limits.  The trachea is midline.  Bony thorax is intact.  IMPRESSION: No acute cardiopulmonary disease.   Original Report Authenticated By: Richarda Overlie, M.D.    Labs:  Basic Metabolic Panel:  Recent Labs Lab 07/24/12 1552 07/24/12 2230 07/25/12 0352 07/26/12 0426  NA 140 137 137 137  K 2.9* 2.7* 3.0* 3.5  CL 103 103 102 104  CO2  --  24 26 27   GLUCOSE 102* 135* 113* 105*  BUN 5* 4* 3* 4*  CREATININE 0.90 0.71 0.70 0.67  CALCIUM  --  8.1* 8.4 8.6  MG  --  1.8  --   --   PHOS  --  2.8  --   --    GFR Estimated Creatinine Clearance: 120.8 ml/min (by C-G formula based on Cr of 0.67). Liver Function Tests:  Recent Labs Lab 07/24/12 2230 07/25/12 0352  AST 42* 31  ALT 53* 48*  ALKPHOS 95 107  BILITOT 0.3 0.4  PROT 6.8 6.9  ALBUMIN 2.8* 2.9*   Coagulation profile  Recent Labs Lab 07/24/12 2230  INR 1.20    CBC:  Recent Labs Lab 07/24/12 1543 07/24/12 1552 07/24/12 2230 07/25/12 0352  07/26/12 0426  WBC 14.7*  --  14.4* 13.1* 12.5*  NEUTROABS 10.8*  --  8.9*  --   --   HGB 11.3* 12.2 10.6* 10.8* 9.7*  HCT 35.6* 36.0 31.7* 33.0* 30.4*  MCV 81.8  --  81.5 81.9 81.9  PLT 273  --  241 241 267   CBG:  Recent Labs Lab 07/25/12 0826  GLUCAP 123*   Microbiology Recent Results (from the past 240 hour(s))  RAPID  STREP SCREEN     Status: None   Collection Time    07/24/12  2:49 PM      Result Value Range Status   Streptococcus, Group A Screen (Direct) NEGATIVE  NEGATIVE Final   Comment: (NOTE)     A Rapid Antigen test may result negative if the antigen level in the     sample is below the detection level of this test. The FDA has not     cleared this test as a stand-alone test therefore the rapid antigen     negative result has reflexed to a Group A Strep culture.  CULTURE, GROUP A STREP     Status: None   Collection Time    07/24/12  2:49 PM      Result Value Range Status   Specimen Description THROAT   Final   Special Requests NONE   Final   Culture GROUP A STREP (S.PYOGENES) ISOLATED   Final   Report Status 07/26/2012 FINAL   Final  URINE CULTURE     Status: None   Collection Time    07/24/12  5:30 PM      Result Value Range Status   Specimen Description URINE, CLEAN CATCH   Final   Special Requests NONE   Final   Culture  Setup Time 07/24/2012 23:10   Final   Colony Count >=100,000 COLONIES/ML   Final   Culture ESCHERICHIA COLI   Final   Report Status 07/26/2012 FINAL   Final   Organism ID, Bacteria ESCHERICHIA COLI   Final    Time coordinating discharge: 25 minutes.  Signed:  Laure Leone  Pager [phone] [facility] 07/26/2012, 5:31 PM

## 2012-07-26 NOTE — Progress Notes (Signed)
Nutrition Brief Note  Patient identified on the Malnutrition Screening Tool (MST) Report  Body mass index is 30.37 kg/(m^2). Patient meets criteria for obesity based on current BMI.   Pt admitted with generalized myalgia, fatigue and headaches fro 1 week prior to admission. Pt also reported poor appetite, nausea, and vomiting over past few days. Pt found to have UTI.  Pt reports that she has had no recent weight loss. She says that she wishes she could say that she has lost weight. Pt had received her second regular tray and said that she did fine with her first tray. She was advised to start out slowly with the more bland foods to lessen nausea.   Current diet order is regular, patient is consuming approximately 85% of meals at this time. Labs and medications reviewed.   No nutrition interventions warranted at this time. If nutrition issues arise, please consult RD.   Ebbie Latus RD, LDN

## 2012-07-26 NOTE — Progress Notes (Signed)
[facility] PROGRESS NOTE  Carolyn Summers GNF:[id number] DOB: 1975-05-08 DOA: 07/24/2012 PCP: Provider Not In System  Brief narrative: Carolyn Summers is an 37 y.o. female with no significant PMH who was admitted on 07/24/2012 with acute pyelonephritis and hypokalemia.  Assessment/Plan: Principal Problem:   Sepsis secondary to pyelonephritis with leukocytosis -The patient met SIRS criteria with heart rate greater than 90 and WBC greater than 12,000. She has a source of infection and therefore meets the criteria for sepsis. -Continue empiric Rocephin. -Followup urine cultures. Narrow antibiotics based on culture data. Active Problems:   Normocytic anemia -Likely secondary to iron deficiency from chronic menstrual losses given age.   Hypokalemia -Corrected with oral supplementation. -magnesium WNL.   Code Status: Full. Family Communication: No family at bedside. Disposition Plan: Home when stable.   Medical Consultants:  None.  Other Consultants:  None.  Anti-infectives:  Rocephin 07/24/2012--->  HPI/Subjective: Carolyn Summers is beginning to feel better.  No further nausea or vomiting.  Headache better after Toradol.  Back pain improved.  No diarrhea.  Objective: Filed Vitals:   07/25/12 0636 07/25/12 1406 07/25/12 2128 07/26/12 0426  BP:  129/72 126/69 138/95  Pulse:  85 81 92  Temp:  98.6 F (37 C) 99 F (37.2 C) 99.2 F (37.3 C)  TempSrc:  Oral Oral Oral  Resp:  18 18 18   Height:      Weight: 96 kg (211 lb 10.3 oz)     SpO2:  100% 100% 100%    Intake/Output Summary (Last 24 hours) at 07/26/12 0836 Last data filed at 07/25/12 1835  Gross per 24 hour  Intake    360 ml  Output    800 ml  Net   -440 ml    Exam: Gen:  NAD Cardiovascular:  RRR, II/VI SEM Respiratory:  Lungs CTAB Gastrointestinal:  Abdomen soft, NT/ND, + BS Extremities:  No C/E/C  Data Reviewed: Basic Metabolic Panel:  Recent Labs Lab 07/24/12 1552 07/24/12 2230  07/25/12 0352 07/26/12 0426  NA 140 137 137 137  K 2.9* 2.7* 3.0* 3.5  CL 103 103 102 104  CO2  --  24 26 27   GLUCOSE 102* 135* 113* 105*  BUN 5* 4* 3* 4*  CREATININE 0.90 0.71 0.70 0.67  CALCIUM  --  8.1* 8.4 8.6  MG  --  1.8  --   --   PHOS  --  2.8  --   --    GFR Estimated Creatinine Clearance: 120.8 ml/min (by C-G formula based on Cr of 0.67). Liver Function Tests:  Recent Labs Lab 07/24/12 2230 07/25/12 0352  AST 42* 31  ALT 53* 48*  ALKPHOS 95 107  BILITOT 0.3 0.4  PROT 6.8 6.9  ALBUMIN 2.8* 2.9*   Coagulation profile  Recent Labs Lab 07/24/12 2230  INR 1.20    CBC:  Recent Labs Lab 07/24/12 1543 07/24/12 1552 07/24/12 2230 07/25/12 0352 07/26/12 0426  WBC 14.7*  --  14.4* 13.1* 12.5*  NEUTROABS 10.8*  --  8.9*  --   --   HGB 11.3* 12.2 10.6* 10.8* 9.7*  HCT 35.6* 36.0 31.7* 33.0* 30.4*  MCV 81.8  --  81.5 81.9 81.9  PLT 273  --  241 241 267   Microbiology Recent Results (from the past 240 hour(s))  RAPID STREP SCREEN     Status: None   Collection Time    07/24/12  2:49 PM      Result Value Range Status  Streptococcus, Group A Screen (Direct) NEGATIVE  NEGATIVE Final   Comment: (NOTE)     A Rapid Antigen test may result negative if the antigen level in the     sample is below the detection level of this test. The FDA has not     cleared this test as a stand-alone test therefore the rapid antigen     negative result has reflexed to a Group A Strep culture.     Procedures and Diagnostic Studies: Dg Chest 2 View  07/24/2012   *RADIOLOGY REPORT*  Clinical Data: Cough.  CHEST - 2 VIEW  Comparison: None.  Findings: Two views of the chest were obtained.  Lungs are clear without airspace disease or edema. Heart and mediastinum are within normal limits.  The trachea is midline.  Bony thorax is intact.  IMPRESSION: No acute cardiopulmonary disease.   Original Report Authenticated By: Richarda Overlie, M.D.    Scheduled Meds: . antiseptic oral rinse  15  mL Mouth Rinse BID  . cefTRIAXone (ROCEPHIN)  IV  1 g Intravenous Q24H  . potassium chloride  20 mEq Oral BID  . zolpidem  5 mg Oral QHS   Continuous Infusions: . sodium chloride 75 mL/hr at 07/26/12 0447    Time spent: 25 minutes.   LOS: 2 days   RAMA,CHRISTINA  [facility] Pager [phone].   *Please note that the hospitalists switch teams on Wednesdays. Please call the flow manager at [phone] if you are having difficulty reaching the hospitalist taking care of this patient as she can update you and provide the most up-to-date pager number of provider caring for the patient. If 8PM-8AM, please contact night-coverage at [email or web address], password [facility]  07/26/2012, 8:36 AM

## 2012-07-26 NOTE — Progress Notes (Signed)
Pt. Was discharged home. She was given her discharge instructions and prescriptions and was then transported home by family.

## 2013-04-07 ENCOUNTER — Emergency Department (HOSPITAL_COMMUNITY)
Admission: EM | Admit: 2013-04-07 | Discharge: 2013-04-07 | Discharge status: Against Medical Advice | Payer: Medicaid Other | Attending: Emergency Medicine | Admitting: Emergency Medicine

## 2013-04-07 ENCOUNTER — Encounter (HOSPITAL_COMMUNITY): Payer: Self-pay | Admitting: Emergency Medicine

## 2013-04-07 DIAGNOSIS — S46909A Unspecified injury of unspecified muscle, fascia and tendon at shoulder and upper arm level, unspecified arm, initial encounter: Secondary | ICD-10-CM | POA: Insufficient documentation

## 2013-04-07 DIAGNOSIS — S0993XA Unspecified injury of face, initial encounter: Secondary | ICD-10-CM | POA: Insufficient documentation

## 2013-04-07 DIAGNOSIS — S4980XA Other specified injuries of shoulder and upper arm, unspecified arm, initial encounter: Secondary | ICD-10-CM | POA: Insufficient documentation

## 2013-04-07 DIAGNOSIS — F17200 Nicotine dependence, unspecified, uncomplicated: Secondary | ICD-10-CM | POA: Insufficient documentation

## 2013-04-07 DIAGNOSIS — IMO0002 Reserved for concepts with insufficient information to code with codable children: Secondary | ICD-10-CM | POA: Insufficient documentation

## 2013-04-07 DIAGNOSIS — S199XXA Unspecified injury of neck, initial encounter: Secondary | ICD-10-CM
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
## 2013-04-07 NOTE — ED Notes (Addendum)
Pt consistently on call bell and wanting to know why she hasn't been seen by provider. Informed pt that provider is seeing people that were here before she was and provider will be in soon. Pt states she is not waiting any longer and walked out of ED with steady independent gait.

## 2013-04-07 NOTE — ED Notes (Signed)
[facility] C-collar remains intact.

## 2013-04-07 NOTE — ED Notes (Signed)
Pt denies LOC. Pt denies nausea/vomiting. Pt alert, appro. No acute distress. Skin warm and dry.

## 2013-04-08 ENCOUNTER — Encounter (HOSPITAL_COMMUNITY): Payer: Self-pay | Admitting: Emergency Medicine

## 2013-04-08 ENCOUNTER — Emergency Department (HOSPITAL_COMMUNITY): Payer: Medicaid Other

## 2013-04-08 ENCOUNTER — Emergency Department (HOSPITAL_COMMUNITY)
Admission: EM | Admit: 2013-04-08 | Discharge: 2013-04-08 | Disposition: A | Discharge status: Home / Self Care | Payer: Medicaid Other | Attending: Emergency Medicine | Admitting: Emergency Medicine

## 2013-04-08 DIAGNOSIS — IMO0002 Reserved for concepts with insufficient information to code with codable children: Secondary | ICD-10-CM | POA: Insufficient documentation

## 2013-04-08 DIAGNOSIS — S0993XA Unspecified injury of face, initial encounter: Secondary | ICD-10-CM | POA: Insufficient documentation

## 2013-04-08 DIAGNOSIS — F17200 Nicotine dependence, unspecified, uncomplicated: Secondary | ICD-10-CM | POA: Insufficient documentation

## 2013-04-08 DIAGNOSIS — S3992XA Unspecified injury of lower back, initial encounter: Secondary | ICD-10-CM

## 2013-04-08 DIAGNOSIS — S199XXA Unspecified injury of neck, initial encounter: Secondary | ICD-10-CM

## 2013-04-08 DIAGNOSIS — T7491XA Unspecified adult maltreatment, confirmed, initial encounter: Secondary | ICD-10-CM

## 2013-04-08 MED ORDER — OXYCODONE-ACETAMINOPHEN 5-325 MG PO TABS
2.0000 | ORAL_TABLET | Freq: Once | ORAL | Status: AC
  Administered 2013-04-08: 2 via ORAL
  Filled 2013-04-08: qty 2

## 2013-04-08 MED ORDER — OXYCODONE-ACETAMINOPHEN 5-325 MG PO TABS: 1.0000 | ORAL_TABLET | Freq: Four times a day (QID) | ORAL | Status: DC | PRN

## 2013-04-08 MED ORDER — CYCLOBENZAPRINE HCL 10 MG PO TABS: 10.0000 mg | ORAL_TABLET | Freq: Two times a day (BID) | ORAL | Status: DC | PRN

## 2013-04-08 MED ORDER — DIAZEPAM 5 MG PO TABS
5.0000 mg | ORAL_TABLET | Freq: Once | ORAL | Status: AC
  Administered 2013-04-08: 5 mg via ORAL
  Filled 2013-04-08: qty 1

## 2013-04-08 MED ORDER — IBUPROFEN 600 MG PO TABS: 600.0000 mg | ORAL_TABLET | Freq: Four times a day (QID) | ORAL | Status: DC | PRN

## 2013-04-08 MED ORDER — IBUPROFEN 800 MG PO TABS
800.0000 mg | ORAL_TABLET | Freq: Once | ORAL | Status: AC
  Administered 2013-04-08: 800 mg via ORAL
  Filled 2013-04-08: qty 1
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
## 2013-04-08 NOTE — ED Notes (Signed)
Md Silverio LayYao at bedside evaluating.
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
## 2013-04-08 NOTE — Discharge Instructions (Signed)
Take motrin for pain.   For severe pain, take percocet as prescribed. Do NOT drive with it.   Take flexeril for muscle spasms.   Follow up with your doctor.   Return to ER if you have severe pain, vomiting, headache

## 2014-03-28 ENCOUNTER — Emergency Department (HOSPITAL_COMMUNITY): Payer: Medicaid Other

## 2014-03-28 ENCOUNTER — Encounter (HOSPITAL_COMMUNITY): Payer: Self-pay | Admitting: Emergency Medicine

## 2014-03-28 ENCOUNTER — Emergency Department (HOSPITAL_COMMUNITY)
Admission: EM | Admit: 2014-03-28 | Discharge: 2014-03-28 | Disposition: A | Discharge status: Home / Self Care | Payer: Medicaid Other | Attending: Emergency Medicine | Admitting: Emergency Medicine

## 2014-03-28 DIAGNOSIS — S7012XA Contusion of left thigh, initial encounter: Secondary | ICD-10-CM

## 2014-03-28 DIAGNOSIS — Y9389 Activity, other specified: Secondary | ICD-10-CM | POA: Diagnosis not present

## 2014-03-28 DIAGNOSIS — S39012A Strain of muscle, fascia and tendon of lower back, initial encounter: Secondary | ICD-10-CM | POA: Diagnosis not present

## 2014-03-28 DIAGNOSIS — Y998 Other external cause status: Secondary | ICD-10-CM | POA: Insufficient documentation

## 2014-03-28 DIAGNOSIS — Z720 Tobacco use: Secondary | ICD-10-CM | POA: Diagnosis not present

## 2014-03-28 DIAGNOSIS — Z791 Long term (current) use of non-steroidal anti-inflammatories (NSAID): Secondary | ICD-10-CM | POA: Diagnosis not present

## 2014-03-28 DIAGNOSIS — Z79899 Other long term (current) drug therapy: Secondary | ICD-10-CM | POA: Diagnosis not present

## 2014-03-28 DIAGNOSIS — Y92410 Unspecified street and highway as the place of occurrence of the external cause: Secondary | ICD-10-CM | POA: Diagnosis not present

## 2014-03-28 DIAGNOSIS — S3992XA Unspecified injury of lower back, initial encounter: Secondary | ICD-10-CM | POA: Diagnosis present

## 2014-03-28 MED ORDER — OXYCODONE-ACETAMINOPHEN 5-325 MG PO TABS
1.0000 | ORAL_TABLET | Freq: Once | ORAL | Status: AC
  Administered 2014-03-28: 1 via ORAL
  Filled 2014-03-28: qty 1

## 2014-03-28 MED ORDER — NAPROXEN 500 MG PO TABS: 500.0000 mg | ORAL_TABLET | Freq: Two times a day (BID) | ORAL | Status: DC

## 2014-03-28 MED ORDER — HYDROCODONE-ACETAMINOPHEN 5-325 MG PO TABS: 1.0000 | ORAL_TABLET | Freq: Four times a day (QID) | ORAL | Status: DC | PRN

## 2014-03-28 MED ORDER — CYCLOBENZAPRINE HCL 10 MG PO TABS: 10.0000 mg | ORAL_TABLET | Freq: Two times a day (BID) | ORAL | Status: DC | PRN
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
## 2014-12-28 ENCOUNTER — Emergency Department (HOSPITAL_COMMUNITY)
Admission: EM | Admit: 2014-12-28 | Discharge: 2014-12-28 | Discharge status: Against Medical Advice | Payer: Medicaid Other | Attending: Emergency Medicine | Admitting: Emergency Medicine

## 2014-12-28 ENCOUNTER — Encounter (HOSPITAL_COMMUNITY): Payer: Self-pay | Admitting: Emergency Medicine

## 2014-12-28 DIAGNOSIS — F17210 Nicotine dependence, cigarettes, uncomplicated: Secondary | ICD-10-CM | POA: Insufficient documentation

## 2014-12-28 DIAGNOSIS — J029 Acute pharyngitis, unspecified: Secondary | ICD-10-CM | POA: Insufficient documentation

## 2014-12-28 NOTE — ED Notes (Signed)
Called pt name, no answer.

## 2014-12-28 NOTE — ED Notes (Signed)
PT called times to room x 2

## 2014-12-28 NOTE — ED Notes (Signed)
Called pt name x1, no answer

## 2014-12-28 NOTE — ED Notes (Signed)
Pt sts some sore throat and body aches x 2 days; pt unsure if has fever

## 2014-12-28 NOTE — ED Notes (Signed)
Pt did not answer to being called in subwaiting or regular waiting room.

## 2014-12-28 NOTE — ED Notes (Signed)
Called for pt to be brought back to [facility] with no response in sub waiting or regular waiting area

## 2016-06-27 ENCOUNTER — Encounter (HOSPITAL_COMMUNITY): Payer: Self-pay

## 2016-06-27 ENCOUNTER — Emergency Department (HOSPITAL_COMMUNITY)
Admission: EM | Admit: 2016-06-27 | Discharge: 2016-06-27 | Disposition: A | Discharge status: Home / Self Care | Payer: No Typology Code available for payment source | Attending: Emergency Medicine | Admitting: Emergency Medicine

## 2016-06-27 ENCOUNTER — Emergency Department (HOSPITAL_COMMUNITY): Payer: No Typology Code available for payment source

## 2016-06-27 DIAGNOSIS — Y939 Activity, unspecified: Secondary | ICD-10-CM | POA: Diagnosis not present

## 2016-06-27 DIAGNOSIS — Y92410 Unspecified street and highway as the place of occurrence of the external cause: Secondary | ICD-10-CM | POA: Insufficient documentation

## 2016-06-27 DIAGNOSIS — S39012A Strain of muscle, fascia and tendon of lower back, initial encounter: Secondary | ICD-10-CM | POA: Insufficient documentation

## 2016-06-27 DIAGNOSIS — F17210 Nicotine dependence, cigarettes, uncomplicated: Secondary | ICD-10-CM | POA: Diagnosis not present

## 2016-06-27 DIAGNOSIS — S3992XA Unspecified injury of lower back, initial encounter: Secondary | ICD-10-CM | POA: Diagnosis present

## 2016-06-27 DIAGNOSIS — Y999 Unspecified external cause status: Secondary | ICD-10-CM | POA: Insufficient documentation

## 2016-06-27 MED ORDER — KETOROLAC TROMETHAMINE 30 MG/ML IJ SOLN
30.0000 mg | Freq: Once | INTRAMUSCULAR | Status: AC
  Administered 2016-06-27: 30 mg via INTRAMUSCULAR
  Filled 2016-06-27: qty 1

## 2016-06-27 MED ORDER — METHOCARBAMOL 500 MG PO TABS: 500.0000 mg | ORAL_TABLET | Freq: Two times a day (BID) | ORAL | 0 refills | Status: DC

## 2016-06-27 MED ORDER — NAPROXEN 500 MG PO TABS: 500.0000 mg | ORAL_TABLET | Freq: Two times a day (BID) | ORAL | 0 refills | Status: DC
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
## 2016-06-27 NOTE — Discharge Instructions (Signed)
Takes Naprosyn for pain and inflammation. Take Robaxin for muscle spasms. Try heating pads, stretches, rest. Follow-up with family doctor if not improving in 1 week.
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
## 2019-07-08 ENCOUNTER — Encounter (HOSPITAL_COMMUNITY): Payer: Self-pay | Admitting: Emergency Medicine

## 2019-07-08 ENCOUNTER — Other Ambulatory Visit: Payer: Self-pay

## 2019-07-08 ENCOUNTER — Emergency Department (HOSPITAL_COMMUNITY): Payer: Self-pay

## 2019-07-08 ENCOUNTER — Emergency Department (HOSPITAL_COMMUNITY)
Admission: EM | Admit: 2019-07-08 | Discharge: 2019-07-08 | Disposition: A | Discharge status: Home / Self Care | Payer: Self-pay | Attending: Emergency Medicine | Admitting: Emergency Medicine

## 2019-07-08 DIAGNOSIS — F17210 Nicotine dependence, cigarettes, uncomplicated: Secondary | ICD-10-CM | POA: Insufficient documentation

## 2019-07-08 DIAGNOSIS — M79604 Pain in right leg: Secondary | ICD-10-CM | POA: Insufficient documentation

## 2019-07-08 DIAGNOSIS — Z791 Long term (current) use of non-steroidal anti-inflammatories (NSAID): Secondary | ICD-10-CM | POA: Insufficient documentation

## 2019-07-08 MED ORDER — NAPROXEN 500 MG PO TABS: 500.0000 mg | ORAL_TABLET | Freq: Two times a day (BID) | ORAL | 0 refills | Status: AC

## 2019-07-08 MED ORDER — LIDOCAINE 5 % EX PTCH
1.0000 | MEDICATED_PATCH | CUTANEOUS | Status: DC
  Administered 2019-07-08: 1 via TRANSDERMAL
  Filled 2019-07-08: qty 1

## 2019-07-08 MED ORDER — HYDROCODONE-ACETAMINOPHEN 5-325 MG PO TABS
1.0000 | ORAL_TABLET | Freq: Once | ORAL | Status: AC
  Administered 2019-07-08: 1 via ORAL
  Filled 2019-07-08: qty 1

## 2019-07-08 MED ORDER — PREDNISONE 10 MG (21) PO TBPK: ORAL_TABLET | Freq: Every day | ORAL | 0 refills | Status: DC

## 2019-07-08 MED ORDER — KETOROLAC TROMETHAMINE 15 MG/ML IJ SOLN
15.0000 mg | Freq: Once | INTRAMUSCULAR | Status: AC
  Administered 2019-07-08: 15 mg via INTRAMUSCULAR
  Filled 2019-07-08: qty 1

## 2019-07-08 MED ORDER — PREDNISONE 20 MG PO TABS
60.0000 mg | ORAL_TABLET | Freq: Once | ORAL | Status: AC
  Administered 2019-07-08: 60 mg via ORAL
  Filled 2019-07-08: qty 3

## 2019-07-08 MED ORDER — CYCLOBENZAPRINE HCL 10 MG PO TABS: 10.0000 mg | ORAL_TABLET | Freq: Two times a day (BID) | ORAL | 0 refills | Status: DC | PRN
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
## 2019-07-08 NOTE — ED Triage Notes (Addendum)
Per pt, states she has been having sharp right leg pain for 4 months-moved from [facility] here to get answers regarding her symptoms-no back pain, no trauma or injury

## 2019-07-08 NOTE — Discharge Instructions (Addendum)
You have been seen today for right leg pain. Please read and follow all provided instructions. Return to the emergency room for worsening condition or new concerning symptoms.    The xray does not show any abnormalities with your hip xray.  1. Medications:  -Prescription sent to your pharmacy for flexeril. This is different muscle relaxer than the Robaxin you are taking. You need to stop taking the robaxin. Flexeril can make you drowsy, so do not drive or work while taking.  -Prescription also sent for prednisone steroid taper. You can start taking this tomorrow. Take as directed. This should also help with pain and swelling.  -Prescription also sent for naproxen. This is an anti-inflammatory that should help with pain. Do not  take any additional ibuprofen, Aleve, Motrin as these medicines are all similar.  -You can buy over-the-counter patches to help with your pain as well.  Recommend Salonpas. You should buy the ones with methyl salicylate.   Continue usual home medications Take medications as prescribed. Please review all of the medicines and only take them if you do not have an allergy to them.   2. Treatment: rest, drink plenty of fluids  3. Follow Up:  Please follow up with primary care provider by scheduling an appointment as soon as possible for a visit  If you do not have a primary care physician, contact [facility] at [phone] for referral  -You can also try to follow up with Dr. Sherlean Foot, a sports medicine doctor. I have given you his contact information in your paperwork.  It is also a possibility that you have an allergic reaction to any of the medicines that you have been prescribed - Everybody reacts differently to medications and while MOST people have no trouble with most medicines, you may have a reaction such as nausea, vomiting, rash, swelling, shortness of breath. If this is the case, please stop taking the medicine immediately and contact your physician.  ?

## 2019-10-16 ENCOUNTER — Encounter (HOSPITAL_COMMUNITY): Payer: Self-pay | Admitting: *Deleted

## 2019-10-16 ENCOUNTER — Other Ambulatory Visit: Payer: Self-pay

## 2019-10-16 ENCOUNTER — Emergency Department (HOSPITAL_COMMUNITY): Payer: Self-pay

## 2019-10-16 ENCOUNTER — Emergency Department (HOSPITAL_COMMUNITY)
Admission: EM | Admit: 2019-10-16 | Discharge: 2019-10-16 | Disposition: A | Discharge status: Home / Self Care | Payer: Self-pay | Attending: Emergency Medicine | Admitting: Emergency Medicine

## 2019-10-16 DIAGNOSIS — X58XXXA Exposure to other specified factors, initial encounter: Secondary | ICD-10-CM | POA: Insufficient documentation

## 2019-10-16 DIAGNOSIS — M791 Myalgia, unspecified site: Secondary | ICD-10-CM | POA: Insufficient documentation

## 2019-10-16 DIAGNOSIS — T148XXA Other injury of unspecified body region, initial encounter: Secondary | ICD-10-CM

## 2019-10-16 DIAGNOSIS — F17210 Nicotine dependence, cigarettes, uncomplicated: Secondary | ICD-10-CM | POA: Insufficient documentation

## 2019-10-16 DIAGNOSIS — S76312A Strain of muscle, fascia and tendon of the posterior muscle group at thigh level, left thigh, initial encounter: Secondary | ICD-10-CM | POA: Insufficient documentation

## 2019-10-16 MED ORDER — DICLOFENAC SODIUM 1 % EX GEL: 2.0000 g | Freq: Four times a day (QID) | CUTANEOUS | 0 refills | Status: DC

## 2019-10-16 MED ORDER — OXYCODONE-ACETAMINOPHEN 5-325 MG PO TABS
1.0000 | ORAL_TABLET | Freq: Once | ORAL | Status: AC
  Administered 2019-10-16: 1 via ORAL
  Filled 2019-10-16: qty 1

## 2019-10-16 MED ORDER — METHOCARBAMOL 500 MG PO TABS: 500.0000 mg | ORAL_TABLET | Freq: Two times a day (BID) | ORAL | 0 refills | Status: DC

## 2019-10-16 NOTE — Discharge Instructions (Addendum)
Your ultrasound showed that you did not have a blood clot in your leg. Follow-up with your primary care provider. Take medications as prescribed. Return to the ER if you start to experience worsening pain, swelling, redness, chest pain, injuries or falls.

## 2019-10-16 NOTE — Progress Notes (Signed)
Lower extremity venous LT study completed.  Preliminary results relayed to PA.  See CV Proc for preliminary results report.   Jean Rosenthal, RDMS
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
## 2019-10-16 NOTE — ED Triage Notes (Signed)
Left lower leg pain (lateral)  for about a week, unable to walk without difficulty. Neg homan's sign.

## 2019-10-28 ENCOUNTER — Other Ambulatory Visit: Payer: Self-pay

## 2019-10-28 ENCOUNTER — Encounter (HOSPITAL_COMMUNITY): Payer: Self-pay

## 2019-10-28 ENCOUNTER — Emergency Department (HOSPITAL_COMMUNITY)
Admission: EM | Admit: 2019-10-28 | Discharge: 2019-10-28 | Disposition: A | Discharge status: Home / Self Care | Payer: Medicaid Other | Attending: Emergency Medicine | Admitting: Emergency Medicine

## 2019-10-28 DIAGNOSIS — M5416 Radiculopathy, lumbar region: Secondary | ICD-10-CM

## 2019-10-28 DIAGNOSIS — M79604 Pain in right leg: Secondary | ICD-10-CM | POA: Insufficient documentation

## 2019-10-28 DIAGNOSIS — M79605 Pain in left leg: Secondary | ICD-10-CM | POA: Insufficient documentation

## 2019-10-28 DIAGNOSIS — F17210 Nicotine dependence, cigarettes, uncomplicated: Secondary | ICD-10-CM | POA: Insufficient documentation

## 2019-10-28 MED ORDER — HYDROCODONE-ACETAMINOPHEN 5-325 MG PO TABS
1.0000 | ORAL_TABLET | Freq: Once | ORAL | Status: AC
  Administered 2019-10-28: 1 via ORAL
  Filled 2019-10-28: qty 1

## 2019-10-28 MED ORDER — HYDROCODONE-ACETAMINOPHEN 5-325 MG PO TABS: 1.0000 | ORAL_TABLET | ORAL | 0 refills | Status: DC | PRN

## 2019-10-28 MED ORDER — DIAZEPAM 5 MG PO TABS
10.0000 mg | ORAL_TABLET | Freq: Once | ORAL | Status: AC
  Administered 2019-10-28: 10 mg via ORAL
  Filled 2019-10-28: qty 2

## 2019-10-28 MED ORDER — PREDNISONE 20 MG PO TABS
60.0000 mg | ORAL_TABLET | Freq: Once | ORAL | Status: AC
  Administered 2019-10-28: 60 mg via ORAL
  Filled 2019-10-28: qty 3

## 2019-10-28 MED ORDER — PREDNISONE 10 MG PO TABS: ORAL_TABLET | ORAL | 0 refills | Status: DC

## 2019-10-28 MED ORDER — DIAZEPAM 10 MG PO TABS: 10.0000 mg | ORAL_TABLET | Freq: Three times a day (TID) | ORAL | 0 refills | Status: DC | PRN
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
## 2019-10-28 NOTE — Discharge Instructions (Signed)
The pain you are having in her legs is most likely from the lower back.  When you had imaging done in 2020, there was arthritis and a bulging disc if your lower back.  We are referring you to an orthopedic doctor for further evaluation and treatment.  They may need to do an MRI to evaluate your discomfort further.

## 2019-10-28 NOTE — ED Triage Notes (Signed)
Pt presents with c/o bilateral leg pain. Pt reports that her leg pain started 3 weeks ago and she was seen here 2 weeks ago for same and the medicine she received is no longer working.

## 2019-11-01 ENCOUNTER — Emergency Department (HOSPITAL_COMMUNITY): Payer: Self-pay

## 2019-11-01 ENCOUNTER — Other Ambulatory Visit: Payer: Self-pay

## 2019-11-01 ENCOUNTER — Encounter (HOSPITAL_COMMUNITY): Payer: Self-pay

## 2019-11-01 ENCOUNTER — Emergency Department (HOSPITAL_COMMUNITY)
Admission: EM | Admit: 2019-11-01 | Discharge: 2019-11-01 | Disposition: A | Discharge status: Home / Self Care | Payer: Self-pay | Attending: Emergency Medicine | Admitting: Emergency Medicine

## 2019-11-01 DIAGNOSIS — G8929 Other chronic pain: Secondary | ICD-10-CM | POA: Insufficient documentation

## 2019-11-01 DIAGNOSIS — M79604 Pain in right leg: Secondary | ICD-10-CM

## 2019-11-01 DIAGNOSIS — F17210 Nicotine dependence, cigarettes, uncomplicated: Secondary | ICD-10-CM | POA: Insufficient documentation

## 2019-11-01 DIAGNOSIS — R202 Paresthesia of skin: Secondary | ICD-10-CM | POA: Insufficient documentation

## 2019-11-01 DIAGNOSIS — M79651 Pain in right thigh: Secondary | ICD-10-CM | POA: Insufficient documentation

## 2019-11-01 DIAGNOSIS — M545 Low back pain, unspecified: Secondary | ICD-10-CM

## 2019-11-01 DIAGNOSIS — M5459 Other low back pain: Secondary | ICD-10-CM | POA: Insufficient documentation

## 2019-11-01 MED ORDER — DICLOFENAC SODIUM 1 % EX GEL: 2.0000 g | Freq: Four times a day (QID) | CUTANEOUS | 1 refills | Status: DC

## 2019-11-01 MED ORDER — KETOROLAC TROMETHAMINE 15 MG/ML IJ SOLN
15.0000 mg | Freq: Once | INTRAMUSCULAR | Status: AC
  Administered 2019-11-01: 15 mg via INTRAMUSCULAR
  Filled 2019-11-01: qty 1

## 2019-11-01 MED ORDER — METHOCARBAMOL 500 MG PO TABS: 500.0000 mg | ORAL_TABLET | Freq: Two times a day (BID) | ORAL | 0 refills | Status: DC

## 2019-11-01 MED ORDER — HYDROCODONE-ACETAMINOPHEN 5-325 MG PO TABS
2.0000 | ORAL_TABLET | Freq: Once | ORAL | Status: AC
  Administered 2019-11-01: 2 via ORAL
  Filled 2019-11-01: qty 2

## 2019-11-01 NOTE — ED Notes (Signed)
An After Visit Summary was printed and given to the patient. Discharge instructions given and no further questions at this time.  

## 2019-11-01 NOTE — ED Triage Notes (Signed)
Pt BIB EMS from home. Pt c/o lower back pain x1 month. Pt states she was seen in [facility] ED a few days ago and the meds given were not helpful. Pt states she is having numbness in legs bilaterally. Pt states she fell at home, unwitnessed. Pt has hx of arthritis and muscle spasms. Pain 10/10.
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
## 2019-11-01 NOTE — Discharge Instructions (Addendum)
We were unable to obtain imaging today.  As we discussed I have low suspicion for the finger fracture.  It is important that you follow-up with the [facility] orthopedic clinic that I have referred you to.  Please call tomorrow to make an appointment.  Please use Tylenol and ibuprofen as instructed below.  I have also prescribed you Robaxin. Please use Tylenol or ibuprofen for pain.  You may use 600 mg ibuprofen every 6 hours or 1000 mg of Tylenol every 6 hours.  You may choose to alternate between the 2.  This would be most effective.  Not to exceed 4 g of Tylenol within 24 hours.  Not to exceed 3200 mg ibuprofen 24 hours.

## 2019-11-10 ENCOUNTER — Emergency Department (HOSPITAL_COMMUNITY)
Admission: EM | Admit: 2019-11-10 | Discharge: 2019-11-11 | Disposition: A | Discharge status: Home / Self Care | Payer: Medicaid Other | Attending: Emergency Medicine | Admitting: Emergency Medicine

## 2019-11-10 ENCOUNTER — Encounter (HOSPITAL_COMMUNITY): Payer: Self-pay

## 2019-11-10 DIAGNOSIS — F17210 Nicotine dependence, cigarettes, uncomplicated: Secondary | ICD-10-CM | POA: Insufficient documentation

## 2019-11-10 DIAGNOSIS — M5431 Sciatica, right side: Secondary | ICD-10-CM | POA: Insufficient documentation

## 2019-11-10 NOTE — ED Triage Notes (Signed)
Pt bib [facility] from home w/ c/o R leg pain. Pt states she took 6 ibuprofen of unknown dose with no relief of pain. Pt reports 10/10 pain. Pt seen her recently for the same.

## 2019-11-11 MED ORDER — PREDNISONE 20 MG PO TABS: 40.0000 mg | ORAL_TABLET | Freq: Every day | ORAL | 0 refills | Status: DC

## 2019-11-11 MED ORDER — KETOROLAC TROMETHAMINE 30 MG/ML IJ SOLN
30.0000 mg | Freq: Once | INTRAMUSCULAR | Status: AC
  Administered 2019-11-11: 30 mg via INTRAMUSCULAR
  Filled 2019-11-11: qty 1

## 2019-11-11 MED ORDER — HYDROCODONE-ACETAMINOPHEN 5-325 MG PO TABS
2.0000 | ORAL_TABLET | Freq: Once | ORAL | Status: AC
  Administered 2019-11-11: 2 via ORAL
  Filled 2019-11-11: qty 2

## 2019-11-11 NOTE — ED Notes (Signed)
E-signature pad unavailable at time of pt discharge. This RN discussed discharge materials with pt and answered all pt questions. Pt stated understanding of discharge material. ? ?
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
## 2019-11-12 ENCOUNTER — Telehealth: Payer: Self-pay | Admitting: *Deleted

## 2019-11-12 NOTE — Telephone Encounter (Signed)
Pt called regarding misplaced printed Rx. RNCM called in Rx for prednisone as written and requested by pt.

## 2019-11-17 DIAGNOSIS — F17210 Nicotine dependence, cigarettes, uncomplicated: Secondary | ICD-10-CM | POA: Insufficient documentation

## 2019-11-17 DIAGNOSIS — G8929 Other chronic pain: Secondary | ICD-10-CM | POA: Insufficient documentation

## 2019-11-17 DIAGNOSIS — M79661 Pain in right lower leg: Secondary | ICD-10-CM | POA: Insufficient documentation

## 2019-11-18 ENCOUNTER — Encounter (HOSPITAL_COMMUNITY): Payer: Self-pay | Admitting: *Deleted

## 2019-11-18 ENCOUNTER — Emergency Department (HOSPITAL_COMMUNITY)
Admission: EM | Admit: 2019-11-18 | Discharge: 2019-11-18 | Disposition: A | Discharge status: Home / Self Care | Payer: Medicaid Other | Attending: Emergency Medicine | Admitting: Emergency Medicine

## 2019-11-18 DIAGNOSIS — G8929 Other chronic pain: Secondary | ICD-10-CM

## 2019-11-18 MED ORDER — GABAPENTIN 100 MG PO CAPS: 100.0000 mg | ORAL_CAPSULE | Freq: Three times a day (TID) | ORAL | 0 refills | Status: DC

## 2019-11-18 MED ORDER — DEXAMETHASONE SODIUM PHOSPHATE 10 MG/ML IJ SOLN
10.0000 mg | Freq: Once | INTRAMUSCULAR | Status: AC
  Administered 2019-11-18: 10 mg via INTRAMUSCULAR
  Filled 2019-11-18: qty 1

## 2019-11-18 MED ORDER — METHOCARBAMOL 500 MG PO TABS: 500.0000 mg | ORAL_TABLET | Freq: Three times a day (TID) | ORAL | 0 refills | Status: DC | PRN
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
## 2019-11-18 NOTE — ED Triage Notes (Signed)
Pt with chronic leg pain to the RLE that has been going on "for a year" but has been unable to "get to the bottom of this". Pt states that she has some pain control at times but then will have "flare ups".

## 2019-11-18 NOTE — Discharge Instructions (Signed)
You were seen in the emergency department today for back and right leg pain.  You are given a shot of steroids in the emergency department.  We are sending you home with the following medicines: -Gabapentin: This is a medication to help with nerve pain, please take this 3 times per day - Robaxin: this is the muscle relaxer I have prescribed, this is meant to help with muscle tightness.   Be aware that these medications may make you drowsy therefore the first time you take this it should be at a time you are in an environment where you can rest. Do not drive or operate heavy machinery when taking this medication. Do not drink alcohol or take other sedating medications with this medicine such as narcotics or benzodiazepines.   You make take Tylenol per over the counter dosing with these medications.   We have prescribed you new medication(s) today. Discuss the medications prescribed today with your pharmacist as they can have adverse effects and interactions with your other medicines including over the counter and prescribed medications. Seek medical evaluation if you start to experience new or abnormal symptoms after taking one of these medicines, seek care immediately if you start to experience difficulty breathing, feeling of your throat closing, facial swelling, or rash as these could be indications of a more serious allergic reaction   Please apply heat to your back. We have sent a referral to physical therapy for possible evaluation.  We have provided information for several local spine specialist groups.  Please call try and get follow-up as soon as possible.  You may also alternate telehealth community wellness clinic for general follow-up and management.  Return to the ER for new or worsening symptoms including but not limited to worsening pain, loss of control of bowel or bladder, numbness, weakness, inability walking, fever, or any other concerns.

## 2019-11-18 NOTE — ED Notes (Signed)
Patient c/o right leg pain.

## 2019-11-21 ENCOUNTER — Other Ambulatory Visit: Payer: Self-pay

## 2019-11-21 ENCOUNTER — Emergency Department (HOSPITAL_COMMUNITY)
Admission: EM | Admit: 2019-11-21 | Discharge: 2019-11-21 | Disposition: A | Discharge status: Home / Self Care | Payer: Medicaid Other | Attending: Emergency Medicine | Admitting: Emergency Medicine

## 2019-11-21 ENCOUNTER — Encounter (HOSPITAL_COMMUNITY): Payer: Self-pay | Admitting: Emergency Medicine

## 2019-11-21 DIAGNOSIS — F17210 Nicotine dependence, cigarettes, uncomplicated: Secondary | ICD-10-CM | POA: Insufficient documentation

## 2019-11-21 DIAGNOSIS — M79604 Pain in right leg: Secondary | ICD-10-CM | POA: Insufficient documentation

## 2019-11-21 MED ORDER — NAPROXEN 375 MG PO TABS: 375.0000 mg | ORAL_TABLET | Freq: Two times a day (BID) | ORAL | 0 refills | Status: DC

## 2019-11-21 MED ORDER — OXYCODONE-ACETAMINOPHEN 5-325 MG PO TABS
1.0000 | ORAL_TABLET | Freq: Once | ORAL | Status: AC
  Administered 2019-11-21: 1 via ORAL
  Filled 2019-11-21: qty 1

## 2019-11-21 NOTE — ED Triage Notes (Signed)
Pt reports shooting R leg pain that goes from her hip toward her foot. She states that she has fallen 2 times since Friday. Denies injury. No blood thinners.

## 2019-11-21 NOTE — Discharge Instructions (Addendum)
You are seen today for chronic right leg pain, as we discussed you need to follow-up with a sports medicine doctor.  Please schedule appointment with them.  You can also go to [facility] if you start having worsening pain and cannot get an appointment sooner.  Please take the naproxen for the next 10 days, this will help with the inflammation and pain.  Please come back to the emergency department for any new worsening concerning symptoms or if you have any more falls.  I want you to follow-up with a primary care doctor as well.  You can go to [facility].
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
## 2019-11-28 ENCOUNTER — Emergency Department (HOSPITAL_COMMUNITY)
Admission: EM | Admit: 2019-11-28 | Discharge: 2019-11-28 | Disposition: A | Discharge status: Home / Self Care | Payer: Self-pay | Attending: Emergency Medicine | Admitting: Emergency Medicine

## 2019-11-28 ENCOUNTER — Encounter (HOSPITAL_COMMUNITY): Payer: Self-pay

## 2019-11-28 ENCOUNTER — Emergency Department (HOSPITAL_COMMUNITY): Payer: Self-pay

## 2019-11-28 ENCOUNTER — Other Ambulatory Visit: Payer: Self-pay

## 2019-11-28 DIAGNOSIS — F17210 Nicotine dependence, cigarettes, uncomplicated: Secondary | ICD-10-CM | POA: Insufficient documentation

## 2019-11-28 DIAGNOSIS — M5431 Sciatica, right side: Secondary | ICD-10-CM | POA: Insufficient documentation

## 2019-11-28 MED ORDER — OXYCODONE-ACETAMINOPHEN 5-325 MG PO TABS
1.0000 | ORAL_TABLET | Freq: Once | ORAL | Status: AC
  Administered 2019-11-28: 1 via ORAL
  Filled 2019-11-28: qty 1

## 2019-11-28 MED ORDER — LIDOCAINE 5 % EX PTCH
2.0000 | MEDICATED_PATCH | Freq: Once | CUTANEOUS | Status: DC
  Administered 2019-11-28: 2 via TRANSDERMAL
  Filled 2019-11-28: qty 2

## 2019-11-28 MED ORDER — KETOROLAC TROMETHAMINE 30 MG/ML IJ SOLN
30.0000 mg | Freq: Once | INTRAMUSCULAR | Status: AC
  Administered 2019-11-28: 30 mg via INTRAMUSCULAR
  Filled 2019-11-28: qty 1

## 2019-11-28 MED ORDER — OXYCODONE-ACETAMINOPHEN 5-325 MG PO TABS: 1.0000 | ORAL_TABLET | Freq: Four times a day (QID) | ORAL | 0 refills | Status: DC | PRN

## 2019-11-28 MED ORDER — PREDNISONE 20 MG PO TABS
60.0000 mg | ORAL_TABLET | Freq: Once | ORAL | Status: AC
  Administered 2019-11-28: 60 mg via ORAL
  Filled 2019-11-28: qty 3

## 2019-11-28 MED ORDER — METHYLPREDNISOLONE 4 MG PO TBPK: ORAL_TABLET | ORAL | 0 refills | Status: DC

## 2019-11-28 MED ORDER — NAPROXEN 500 MG PO TABS: 500.0000 mg | ORAL_TABLET | Freq: Two times a day (BID) | ORAL | 0 refills | Status: DC

## 2019-11-28 MED ORDER — METHOCARBAMOL 500 MG PO TABS
500.0000 mg | ORAL_TABLET | Freq: Once | ORAL | Status: AC
  Administered 2019-11-28: 500 mg via ORAL
  Filled 2019-11-28: qty 1

## 2019-11-28 NOTE — ED Triage Notes (Signed)
Pt presents with c/o right leg pain for approx 8 months. Pt was seen here on Halloween for same and reports the medicine she is taking is not helping. Pt does have an appointment with at the orthopedics office on Wednesday.

## 2019-11-28 NOTE — Discharge Instructions (Signed)
HOME INSTRUCTIONS Self - care:  The application of heat can help soothe the pain.  Maintaining your daily activities, including walking (this is encouraged), as it will help you get better faster than just staying in bed. Do not life, push, pull anything more than 10 pounds for the next week. I am attaching back exercises that you can do at home to help facilitate your recovery.   Back Exercises - I have attached a handout on back exercises that can be done at home to help facilitate your recovery.   Medications are also useful to help with pain control.   Acetaminophen.  This medication is generally safe, and found over the counter. Take as directed for your age. You should not take more than 8 of the extra strength (500mg ) pills a day (max dose is 4000mg  total OVER one day)  Non steroidal anti inflammatory: This includes medications including Ibuprofen, naproxen and Mobic; These medications help both pain and swelling and are very useful in treating back pain.  They should be taken with food, as they can cause stomach upset, and more seriously, stomach bleeding. Do not combine the medications.   Lidocaine Patch: Salon Pas lidocaine patches (blue and silver box) can be purchased over the counter and worn for 12 hours for local pain relief   Percocet: Use prescribed Percocet as needed for breakthrough severe pain, this can cause drowsiness do not take before driving.  Muscle relaxants:  These medications can help with muscle tightness that is a cause of lower back pain.  Most of these medications can cause drowsiness, and it is not safe to drive or use dangerous machinery while taking them. They are primarily helpful when taken at night before sleep.  Prednisone - This is an oral steroid.  This medication is best taken with food in the morning.  Please note that this medication can cause anxiety, mood swings, muscle fatigue, increased hunger, weight gain (sodium/fluid retention), poor sleep as well  as other symptoms. If you are a diabetic, please monitor your blood sugars at home as this medication can increase your blood sugars. Call your pharmacist if you have any questions.  You will need to follow up with the orthopedist this week as planned.  Be aware that if you develop new symptoms, such as a fever, leg weakness, difficulty with or loss of control of your urine or bowels, abdominal pain, or more severe pain, you will need to seek medical attention and/or return to the Emergency department. Additional Information:  Your vital signs today were: BP 105/89    Pulse 82    Temp 98.1 F (36.7 C) (Oral)    Resp 16    LMP 11/28/2019 (Approximate) Comment: tubligation 1999   SpO2 100%  If your blood pressure (BP) was elevated above 135/85 this visit, please have this repeated by your doctor within one month. ---------------
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
## 2019-11-28 NOTE — Progress Notes (Addendum)
TOC CM/CSW consulted with pt on her need for a primary care, pharmacy and financial counseling.  Robyne Peers, CM/[facility] has been notified of pts need.  Erskine Squibb will reach out on Monday, 11/29/2019, as facility is closed on the weekend.  CSW will continue to follow for dc needs.  Bryton Waight Tarpley-Carter, MSW, LCSW-A Pronouns:  She, Her, Hers                  [facility] ED Transitions of CareClinical Social Worker [email or web address] [phone]

## 2019-11-28 NOTE — ED Notes (Signed)
EDP at bedside  

## 2019-11-28 NOTE — ED Notes (Signed)
Patient reports she has been in a room for an hour and no provider has seen her yet. Patient reports she is upset and in pain. Patient in tears. RN apologized for the delay and asked patient if she would like a warm blanket, hot pack, ice pack or anything else nonpharmacologic, pt denies at this time.  RN made MD and PA aware of patient request to be seen. ED providers currently in with a critical patient. Charge RN made aware of situation.

## 2019-11-28 NOTE — Care Management (Signed)
Patient with no PCP, in the ED numerous times for same problem. PCP assigned in patient instructions. [facility] offers primary care, pharmacy and financial counseling.The patient needs to make a appointment on Monday, as [facility] is not open on the weekends. Marland Kitchen

## 2019-11-30 ENCOUNTER — Telehealth: Payer: Self-pay

## 2019-11-30 NOTE — Telephone Encounter (Signed)
Message received from [facility], LCSWA requesting an appointment for patient to establish care with PCP at [facility].  Call placed to patient and an appointment was scheduled for 12/15/2019. Provided her with the address and phone number for the clinic and also explained the process for applying for [facility].    Update sent to R. Tarpley-Carter, LCSWA

## 2019-12-01 ENCOUNTER — Ambulatory Visit (INDEPENDENT_AMBULATORY_CARE_PROVIDER_SITE_OTHER): Payer: Self-pay | Admitting: Orthopedic Surgery

## 2019-12-01 ENCOUNTER — Telehealth: Payer: Self-pay | Admitting: Orthopedic Surgery

## 2019-12-01 ENCOUNTER — Encounter: Payer: Self-pay | Admitting: Orthopedic Surgery

## 2019-12-01 ENCOUNTER — Ambulatory Visit (INDEPENDENT_AMBULATORY_CARE_PROVIDER_SITE_OTHER): Payer: Self-pay

## 2019-12-01 ENCOUNTER — Other Ambulatory Visit: Payer: Self-pay

## 2019-12-01 VITALS — Ht 69.5 in | Wt 211.0 lb

## 2019-12-01 DIAGNOSIS — M545 Low back pain, unspecified: Secondary | ICD-10-CM

## 2019-12-01 DIAGNOSIS — G8929 Other chronic pain: Secondary | ICD-10-CM

## 2019-12-01 MED ORDER — METHOCARBAMOL 500 MG PO TABS: 500.0000 mg | ORAL_TABLET | Freq: Three times a day (TID) | ORAL | 0 refills | Status: DC | PRN

## 2019-12-01 MED ORDER — OXYCODONE-ACETAMINOPHEN 5-325 MG PO TABS
1.0000 | ORAL_TABLET | Freq: Three times a day (TID) | ORAL | 0 refills | Status: DC | PRN
Start: 2019-12-01 — End: 2019-12-24

## 2019-12-01 NOTE — Telephone Encounter (Signed)
Tried calling patient. No answer. LMVM advising this was done.

## 2019-12-01 NOTE — Telephone Encounter (Signed)
Patient called and asking for a call back. Patient is asking when medication will be sent to pharmacy because she doesn't have transportation and will need to make sure she can get medication. Please call patient when medication is called to pharmacy. Patient phone number is [phone].

## 2019-12-04 ENCOUNTER — Encounter: Payer: Self-pay | Admitting: Orthopedic Surgery
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
## 2019-12-06 ENCOUNTER — Telehealth: Payer: Self-pay | Admitting: Orthopedic Surgery

## 2019-12-06 NOTE — Telephone Encounter (Signed)
Please advise. Thanks.  

## 2019-12-06 NOTE — Telephone Encounter (Signed)
Pt called asking if she can get more steroids she states they were helping.. the hospital gave her predizone but now she is out.

## 2019-12-07 ENCOUNTER — Telehealth: Payer: Self-pay | Admitting: Orthopedic Surgery

## 2019-12-07 NOTE — Telephone Encounter (Signed)
See below. Patient calling again. Advised if no response today would address first thing tomorrow.

## 2019-12-07 NOTE — Telephone Encounter (Signed)
Patient returning call to Kathryne Gin. I read patient what was on chart and patient stated to have questions for Lauren. Please call patient at [phone].

## 2019-12-07 NOTE — Telephone Encounter (Signed)
I tried calling. LMVM advising that I had sent note to [facility] but they were in surgery today.

## 2019-12-07 NOTE — Telephone Encounter (Signed)
Patient called asking for update about medication being sent in. Patient is asking for a call when prescription has been sent to pharmacy. Patient phone number is  [phone].

## 2019-12-08 ENCOUNTER — Telehealth: Payer: Self-pay

## 2019-12-08 ENCOUNTER — Encounter: Payer: Self-pay | Admitting: Orthopedic Surgery

## 2019-12-08 NOTE — Telephone Encounter (Signed)
Last dosepack was prescribed on 11/28/2019 so I think it's too soon to give another

## 2019-12-08 NOTE — Telephone Encounter (Signed)
Called and discussed

## 2019-12-08 NOTE — Telephone Encounter (Signed)
Pls advise. thx 

## 2019-12-08 NOTE — Telephone Encounter (Signed)
Patient called regarding last message she would like a call [phone]

## 2019-12-08 NOTE — Progress Notes (Signed)
Patient ID: Carolyn Summers, female   DOB: 1975/09/10, 44 y.o.   MRN: [id number]  Virtual Visit via Telephone Note  I connected with Carolyn Summers on 12/15/19 at  8:50 AM EST by telephone and verified that I am speaking with the correct person using two identifiers.  Location: Patient: Carolyn Summers Provider: Georgian Co, PA-C   I discussed the limitations, risks, security and privacy concerns of performing an evaluation and management service by telephone and the availability of in person appointments. I also discussed with the patient that there may be a patient responsible charge related to this service. The patient expressed understanding and agreed to proceed.  PATIENT visit by telephone virtually in the context of Covid-19 pandemic. Patient location:  home My Location:  [facility] office Persons on the call:  Me and the patient.  Screened by [name]     History of Present Illness: After ED visit 11/28/2019.  Seen by ortho 12/01/2019.  Diagnosed with sciatica and has MRI pending on December 3.  Requesting Oxycodone which I explained the orthopedist will prescribe more of that IF they deem that appropriate.  She needs a PCP.    From ED note: Normal neurological exam, no evidence of urinary incontinence or retention, pain is consistently reproducible and consistent with sciatica.  Patient does have some pain over the right hip where she had a fall about a week ago, initially declined imaging when she was evaluated on 10/31, x-rays of the hip obtained today which are unremarkable.  There is no evidence of AAA or concern for dissection at this time.   Patient can walk but states is painful.  No loss of bowel or bladder control.  No concern for cauda equina.  No fever, night sweats, weight loss, h/o cancer, IVDU.  Pain treated here in the department with adequate improvement. RICE protocol and pain medicine indicated and discussed with patient.  Patient has upcoming follow-up with  orthopedics this week.  I have also discussed reasons to return immediately to the ER.  Patient expresses understanding and agrees with plan   From ortho: Assessment & Plan: Visit Diagnoses:  1. Chronic right-sided low back pain, unspecified whether sciatica present     Plan: Patient is a 44 year old female presents complaining of radicular right leg pain.  She has altered posture and diffuse weakness of the right leg.  She has been dealing with these symptoms for about 9 months without any significant relief.  She feels her symptoms are worsening.  Plan to order MRI of the lumbar spine to evaluate right radiculopathy.  Patient agreed with plan.    Observations/Objective: NAD.  A&Ox3   Assessment and Plan: 1. Influenza vaccine refused  2. Radiculopathy, unspecified spinal region - naproxen (NAPROSYN) 500 MG tablet; Take 1 tablet (500 mg total) by mouth 2 (two) times daily.  Dispense: 60 tablet; Refill: 0  3. Muscle spasm - methocarbamol (ROBAXIN) 500 MG tablet; Take 2 tablets (1,000 mg total) by mouth every 8 (eight) hours as needed for muscle spasms.  Dispense: 60 tablet; Refill: 0  4. Encounter for examination following treatment at hospital Contact ortho for stronger meds if she needs them.  I did increase the dose of her methocarbamol  Follow Up Instructions: Assign PCP in 2 months.     I discussed the assessment and treatment plan with the patient. The patient was provided an opportunity to ask questions and all were answered. The patient agreed with the plan and demonstrated an understanding of  the instructions.   The patient was advised to call back or seek an in-person evaluation if the symptoms worsen or if the condition fails to improve as anticipated.  I provided 14 minutes of non-face-to-face time during this encounter.   Georgian Co, PA-C

## 2019-12-08 NOTE — Telephone Encounter (Signed)
thx

## 2019-12-08 NOTE — Telephone Encounter (Signed)
Called and discussed. See other note for further documentation.

## 2019-12-08 NOTE — Telephone Encounter (Signed)
IC patient and advised. Patient started yelling at me stating what was she supposed to do about this because she cannot walk. Her MRI is scheduled for December. She was very disrespectful on the phone when I tried to talk with her and let her know she could continue pain medication and anti-inflammatory but that there was not much we could do for her until we had the scan to figure out exactly was going on. She is requesting that one of you to call her directly and discuss.

## 2019-12-09 ENCOUNTER — Encounter (HOSPITAL_COMMUNITY): Payer: Self-pay

## 2019-12-09 ENCOUNTER — Emergency Department (HOSPITAL_COMMUNITY)
Admission: EM | Admit: 2019-12-09 | Discharge: 2019-12-09 | Disposition: A | Discharge status: Home / Self Care | Payer: Medicaid Other | Attending: Emergency Medicine | Admitting: Emergency Medicine

## 2019-12-09 ENCOUNTER — Other Ambulatory Visit: Payer: Self-pay

## 2019-12-09 DIAGNOSIS — M5431 Sciatica, right side: Secondary | ICD-10-CM

## 2019-12-09 DIAGNOSIS — F17210 Nicotine dependence, cigarettes, uncomplicated: Secondary | ICD-10-CM | POA: Insufficient documentation

## 2019-12-09 DIAGNOSIS — G8929 Other chronic pain: Secondary | ICD-10-CM | POA: Insufficient documentation

## 2019-12-09 MED ORDER — PREDNISONE 20 MG PO TABS
60.0000 mg | ORAL_TABLET | Freq: Once | ORAL | Status: AC
  Administered 2019-12-09: 60 mg via ORAL
  Filled 2019-12-09: qty 3

## 2019-12-09 MED ORDER — PREDNISONE 10 MG PO TABS: 20.0000 mg | ORAL_TABLET | Freq: Every day | ORAL | 0 refills | Status: DC

## 2019-12-09 MED ORDER — OXYCODONE-ACETAMINOPHEN 5-325 MG PO TABS
1.0000 | ORAL_TABLET | Freq: Three times a day (TID) | ORAL | 0 refills | Status: DC | PRN
Start: 2019-12-09 — End: 2019-12-24

## 2019-12-09 MED ORDER — METHOCARBAMOL 500 MG PO TABS: 500.0000 mg | ORAL_TABLET | Freq: Two times a day (BID) | ORAL | 0 refills | Status: DC

## 2019-12-09 MED ORDER — OXYCODONE-ACETAMINOPHEN 5-325 MG PO TABS
1.0000 | ORAL_TABLET | Freq: Once | ORAL | Status: AC
  Administered 2019-12-09: 1 via ORAL
  Filled 2019-12-09: qty 1
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
## 2019-12-09 NOTE — ED Triage Notes (Signed)
Patient c/o right leg pain x several months. Patient states she did a follow up with ortho and is suppose to have an MRi on 12/24/19.  Patient states her pain medication is not helping the pain.

## 2019-12-09 NOTE — Discharge Instructions (Signed)
Please return for any problem.  °

## 2019-12-15 ENCOUNTER — Ambulatory Visit: Payer: Self-pay | Attending: Physician Assistant | Admitting: Physician Assistant

## 2019-12-15 ENCOUNTER — Other Ambulatory Visit: Payer: Self-pay

## 2019-12-15 ENCOUNTER — Other Ambulatory Visit: Payer: Self-pay | Admitting: Physician Assistant

## 2019-12-15 DIAGNOSIS — M62838 Other muscle spasm: Secondary | ICD-10-CM

## 2019-12-15 DIAGNOSIS — M5410 Radiculopathy, site unspecified: Secondary | ICD-10-CM

## 2019-12-15 DIAGNOSIS — Z09 Encounter for follow-up examination after completed treatment for conditions other than malignant neoplasm: Secondary | ICD-10-CM

## 2019-12-15 DIAGNOSIS — Z2821 Immunization not carried out because of patient refusal: Secondary | ICD-10-CM

## 2019-12-15 MED ORDER — NAPROXEN 500 MG PO TABS: 500.0000 mg | ORAL_TABLET | Freq: Two times a day (BID) | ORAL | 0 refills | Status: DC

## 2019-12-15 MED ORDER — METHOCARBAMOL 500 MG PO TABS: 1000.0000 mg | ORAL_TABLET | Freq: Three times a day (TID) | ORAL | 0 refills | Status: DC | PRN

## 2019-12-15 MED FILL — NAPROXEN 500 MG TABLET: 500 | 30 days supply | Qty: 60 | Fill #0

## 2019-12-15 MED FILL — METHOCARBAMOL 500 MG TABS: 500 | 10 days supply | Qty: 60 | Fill #0

## 2019-12-15 NOTE — Progress Notes (Signed)
Pt states she can't stand up straight

## 2019-12-21 ENCOUNTER — Telehealth: Payer: Self-pay | Admitting: Orthopedic Surgery

## 2019-12-21 NOTE — Telephone Encounter (Signed)
Pt would like a refill of her oxycodone rx and she would like to be notified when it's been sent in  [phone]

## 2019-12-21 NOTE — Telephone Encounter (Signed)
Please advise 

## 2019-12-22 NOTE — Telephone Encounter (Signed)
Pt called again wanting to get a refill of her oxycodone rx and would like a CB even if it's to let her know there will be a wait.  [phone]

## 2019-12-23 ENCOUNTER — Telehealth: Payer: Self-pay

## 2019-12-23 NOTE — Telephone Encounter (Signed)
Patient called regarding last message about rx refill CB:[phone]

## 2019-12-24 ENCOUNTER — Other Ambulatory Visit: Payer: Self-pay | Admitting: Surgical

## 2019-12-24 ENCOUNTER — Ambulatory Visit
Admission: RE | Admit: 2019-12-24 | Discharge: 2019-12-24 | Disposition: A | Discharge status: Home / Self Care | Payer: No Typology Code available for payment source | Source: Ambulatory Visit | Attending: Orthopedic Surgery | Admitting: Orthopedic Surgery

## 2019-12-24 ENCOUNTER — Other Ambulatory Visit: Payer: Self-pay

## 2019-12-24 DIAGNOSIS — G8929 Other chronic pain: Secondary | ICD-10-CM

## 2019-12-24 MED ORDER — OXYCODONE-ACETAMINOPHEN 5-325 MG PO TABS: 1.0000 | ORAL_TABLET | Freq: Three times a day (TID) | ORAL | 0 refills | Status: DC | PRN

## 2019-12-24 NOTE — Telephone Encounter (Signed)
See below. Patient calling again. See other message sent to you earlier this week.

## 2019-12-28 NOTE — Progress Notes (Signed)
I called the patient.  Please cancel her appointment for the ninth.  Can you see if Carolyn Summers can see her this week.  She has an operative problem in the back which I do not think is going to be improved with nonoperative injections.  Thanks

## 2019-12-30 ENCOUNTER — Ambulatory Visit: Payer: Medicaid Other | Admitting: Orthopedic Surgery

## 2020-01-01 ENCOUNTER — Other Ambulatory Visit: Payer: Self-pay

## 2020-01-01 ENCOUNTER — Encounter (HOSPITAL_COMMUNITY): Payer: Self-pay | Admitting: *Deleted

## 2020-01-01 ENCOUNTER — Emergency Department (HOSPITAL_COMMUNITY)
Admission: EM | Admit: 2020-01-01 | Discharge: 2020-01-01 | Disposition: A | Discharge status: Home / Self Care | Payer: No Typology Code available for payment source | Attending: Emergency Medicine | Admitting: Emergency Medicine

## 2020-01-01 DIAGNOSIS — F17210 Nicotine dependence, cigarettes, uncomplicated: Secondary | ICD-10-CM | POA: Insufficient documentation

## 2020-01-01 DIAGNOSIS — M5441 Lumbago with sciatica, right side: Secondary | ICD-10-CM

## 2020-01-01 MED ORDER — OXYCODONE-ACETAMINOPHEN 5-325 MG PO TABS
1.0000 | ORAL_TABLET | Freq: Once | ORAL | Status: AC
  Administered 2020-01-01: 15:00:00 1 via ORAL
  Filled 2020-01-01: qty 1

## 2020-01-01 NOTE — ED Notes (Signed)
Patient ambulatory to the restroom without difficulty.

## 2020-01-01 NOTE — Discharge Instructions (Addendum)
You have been seen here for right back pain. I recommend taking over-the-counter pain medications like ibuprofen and/or Tylenol every 6 as needed.  Please follow dosage and on the back of bottle.  I also recommend applying heat to the area and stretching out the muscles as this will help decrease stiffness and pain.  I have given you information on exercises please follow.  Please follow-up with your orthopedist for further evaluation.  Come back to the emergency department if you develop chest pain, shortness of breath, severe abdominal pain, uncontrolled nausea, vomiting, diarrhea.
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
## 2020-01-01 NOTE — ED Triage Notes (Signed)
Pt confirmed to have 2 buldging disc in her lower back by MRI on  Dec 3. She is scheduled for surgery this coming Tuesday. Pain in rt leg.

## 2020-01-01 NOTE — ED Notes (Signed)
Patient visualized ambulating out of department without difficulty. Advised patient to wait for discharge paperwork.

## 2020-01-04 ENCOUNTER — Ambulatory Visit (INDEPENDENT_AMBULATORY_CARE_PROVIDER_SITE_OTHER): Payer: Self-pay | Admitting: Orthopaedic Surgery

## 2020-01-04 ENCOUNTER — Other Ambulatory Visit: Payer: Self-pay

## 2020-01-04 ENCOUNTER — Encounter: Payer: Self-pay | Admitting: Orthopaedic Surgery

## 2020-01-04 DIAGNOSIS — M5116 Intervertebral disc disorders with radiculopathy, lumbar region: Secondary | ICD-10-CM | POA: Insufficient documentation

## 2020-01-04 MED ORDER — HYDROCODONE-ACETAMINOPHEN 5-325 MG PO TABS: 1.0000 | ORAL_TABLET | Freq: Four times a day (QID) | ORAL | 0 refills | Status: DC | PRN
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
## 2020-01-10 ENCOUNTER — Encounter (HOSPITAL_COMMUNITY): Payer: Self-pay

## 2020-01-10 ENCOUNTER — Other Ambulatory Visit: Payer: Self-pay

## 2020-01-10 ENCOUNTER — Encounter (HOSPITAL_COMMUNITY)
Admission: RE | Admit: 2020-01-10 | Discharge: 2020-01-10 | Disposition: A | Discharge status: Home / Self Care | Payer: Self-pay | Source: Ambulatory Visit | Attending: Orthopaedic Surgery | Admitting: Orthopaedic Surgery

## 2020-01-10 DIAGNOSIS — Z01812 Encounter for preprocedural laboratory examination: Secondary | ICD-10-CM | POA: Insufficient documentation

## 2020-01-10 HISTORY — DX: Unspecified osteoarthritis, unspecified site: M19.90

## 2020-01-10 LAB — CBC
HCT: 35.2 % — ABNORMAL LOW (ref 36.0–46.0)
Hemoglobin: 10.6 g/dL — ABNORMAL LOW (ref 12.0–15.0)
MCH: 24.4 pg — ABNORMAL LOW (ref 26.0–34.0)
MCHC: 30.1 g/dL (ref 30.0–36.0)
MCV: 80.9 fL (ref 80.0–100.0)
Platelets: 430 10*3/uL — ABNORMAL HIGH (ref 150–400)
RBC: 4.35 MIL/uL (ref 3.87–5.11)
RDW: 17.3 % — ABNORMAL HIGH (ref 11.5–15.5)
WBC: 11.7 10*3/uL — ABNORMAL HIGH (ref 4.0–10.5)
nRBC: 0 % (ref 0.0–0.2)

## 2020-01-10 LAB — BASIC METABOLIC PANEL
Anion gap: 8 (ref 5–15)
BUN: 9 mg/dL (ref 6–20)
CO2: 23 mmol/L (ref 22–32)
Calcium: 8.9 mg/dL (ref 8.9–10.3)
Chloride: 109 mmol/L (ref 98–111)
Creatinine, Ser: 0.58 mg/dL (ref 0.44–1.00)
GFR, Estimated: >60 mL/min (ref >60)
Glucose, Bld: 114 mg/dL — ABNORMAL HIGH (ref 70–99)
Potassium: 3.5 mmol/L (ref 3.5–5.1)
Sodium: 140 mmol/L (ref 135–145)

## 2020-01-10 LAB — SURGICAL PCR SCREEN
MRSA, PCR: NEGATIVE
Staphylococcus aureus: NEGATIVE

## 2020-01-10 NOTE — Pre-Procedure Instructions (Signed)
Carolyn Summers  01/10/2020      [id number] [place], [place] - [address] AT [facility] [address] [place] [address] Phone: [phone] Fax: [phone]  [facility] [place], [place] - [place] [address] [place] [address] Phone: [phone] Fax: [phone]    Your procedure is scheduled on 01/17/20.  Report to [facility] Admitting at 1030 A.M.  Call this number if you have problems the morning of surgery:  [phone]   Remember:  Do not eat or drink after midnight.   Take these medicines the morning of surgery with A SIP OF WATER ---HYDROCODONE    Do not wear jewelry, make-up or nail polish.  Do not wear lotions, powders, or perfumes, or deodorant.  Do not shave 48 hours prior to surgery.  Men may shave face and neck.  Do not bring valuables to the hospital.  [facility] is not responsible for any belongings or valuables.  Contacts, dentures or bridgework may not be worn into surgery.  Leave your suitcase in the car.  After surgery it may be brought to your room.  For patients admitted to the hospital, discharge time will be determined by your treatment team.  Patients discharged the day of surgery will not be allowed to drive home.   Do not take any aspirin,anti-inflammatories,vitamins,or herbal supplements 5-7 days prior to surgery.  Normal - Preparing for Surgery  Before surgery, you can play an important role.  Because skin is not sterile, your skin needs to be as free of germs as possible.  You can reduce the number of germs on you skin by washing with CHG (chlorahexidine gluconate) soap before surgery.  CHG is an antiseptic cleaner which kills germs and bonds with the skin to continue killing germs even after washing.  Oral Hygiene is also important in reducing the risk of infection.  Remember to brush your teeth with your regular toothpaste the  morning of surgery.  Please DO NOT use if you have an allergy to CHG or antibacterial soaps.  If your skin becomes reddened/irritated stop using the CHG and inform your nurse when you arrive at Short Stay.  Do not shave (including legs and underarms) for at least 48 hours prior to the first CHG shower.  You may shave your face.  Please follow these instructions carefully:   1.  Shower with CHG Soap the night before surgery and the morning of Surgery.  2.  If you choose to wash your hair, wash your hair first as usual with your normal shampoo.  3.  After you shampoo, rinse your hair and body thoroughly to remove the shampoo. 4.  Use CHG as you would any other liquid soap.  You can apply chg directly to the skin and wash gently with a      scrungie or washcloth.           5.  Apply the CHG Soap to your body ONLY FROM THE NECK DOWN.   Do not use on open wounds or open sores. Avoid contact with your eyes, ears, mouth and genitals (private parts).  Wash genitals (private parts) with your normal soap.  6.  Wash thoroughly, paying special attention to the area where your surgery will be performed.  7.  Thoroughly rinse your body with warm water from the neck down.  8.  DO NOT shower/wash with  your normal soap after using and rinsing off the CHG Soap.  9.  Pat yourself dry with a clean towel.            10.  Wear clean pajamas.            11.  Place clean sheets on your bed the night of your first shower and do not sleep with pets.  Day of Surgery  Do not apply any lotions/deoderants the morning of surgery.   Please wear clean clothes to the hospital/surgery center. Remember to brush your teeth with toothpaste.    Please read over the following fact sheets that you were given. Coughing and Deep Breathing

## 2020-01-10 NOTE — Progress Notes (Signed)
PCP - none Cardiologist - none     PT WAS REFERRED TO DR Nira Conn THE ED    Chest x-ray - NA EKG - NA Stress Test - NA ECHO - NA Cardiac Cath - NA   -     COVID TEST- FOR 01/12/20   Anesthesia review: NA  Patient denies shortness of breath, fever, cough and chest pain at PAT appointment   All instructions explained to the patient, with a verbal understanding of the material. Patient agrees to go over the instructions while at home for a better understanding. Patient also instructed to self quarantine after being tested for COVID-19. The opportunity to ask questions was provided.

## 2020-01-12 ENCOUNTER — Other Ambulatory Visit: Payer: Self-pay

## 2020-01-12 ENCOUNTER — Ambulatory Visit (INDEPENDENT_AMBULATORY_CARE_PROVIDER_SITE_OTHER): Payer: Self-pay | Admitting: Surgery

## 2020-01-12 ENCOUNTER — Encounter: Payer: Self-pay | Admitting: Surgery

## 2020-01-12 VITALS — BP 127/80 | HR 101

## 2020-01-12 DIAGNOSIS — M5116 Intervertebral disc disorders with radiculopathy, lumbar region: Secondary | ICD-10-CM

## 2020-01-13 ENCOUNTER — Other Ambulatory Visit (HOSPITAL_COMMUNITY)
Admission: RE | Admit: 2020-01-13 | Discharge: 2020-01-13 | Disposition: A | Discharge status: Home / Self Care | Payer: Medicaid Other | Source: Ambulatory Visit | Attending: Orthopaedic Surgery | Admitting: Orthopaedic Surgery

## 2020-01-13 DIAGNOSIS — Z20822 Contact with and (suspected) exposure to covid-19: Secondary | ICD-10-CM | POA: Diagnosis not present

## 2020-01-13 DIAGNOSIS — Z01812 Encounter for preprocedural laboratory examination: Secondary | ICD-10-CM | POA: Insufficient documentation

## 2020-01-13 LAB — SARS CORONAVIRUS 2 (TAT 6-24 HRS): SARS Coronavirus 2: NEGATIVE

## 2020-01-17 ENCOUNTER — Encounter (HOSPITAL_COMMUNITY)
Admission: RE | Disposition: A | Discharge status: Home / Self Care | Payer: Self-pay | Source: Home / Self Care | Attending: Orthopaedic Surgery

## 2020-01-17 ENCOUNTER — Observation Stay (HOSPITAL_COMMUNITY)
Admission: RE | Admit: 2020-01-17 | Discharge: 2020-01-18 | Disposition: A | Discharge status: Home / Self Care | Payer: Medicaid Other | Attending: Orthopaedic Surgery | Admitting: Orthopaedic Surgery

## 2020-01-17 ENCOUNTER — Ambulatory Visit (HOSPITAL_COMMUNITY): Payer: Medicaid Other

## 2020-01-17 ENCOUNTER — Ambulatory Visit (HOSPITAL_COMMUNITY): Payer: Medicaid Other | Admitting: Anesthesiology

## 2020-01-17 ENCOUNTER — Other Ambulatory Visit: Payer: Self-pay

## 2020-01-17 ENCOUNTER — Encounter (HOSPITAL_COMMUNITY): Payer: Self-pay | Admitting: Orthopaedic Surgery

## 2020-01-17 DIAGNOSIS — M48062 Spinal stenosis, lumbar region with neurogenic claudication: Secondary | ICD-10-CM

## 2020-01-17 DIAGNOSIS — F17210 Nicotine dependence, cigarettes, uncomplicated: Secondary | ICD-10-CM | POA: Insufficient documentation

## 2020-01-17 DIAGNOSIS — M48061 Spinal stenosis, lumbar region without neurogenic claudication: Secondary | ICD-10-CM | POA: Diagnosis present

## 2020-01-17 DIAGNOSIS — Z419 Encounter for procedure for purposes other than remedying health state, unspecified: Secondary | ICD-10-CM

## 2020-01-17 DIAGNOSIS — M5116 Intervertebral disc disorders with radiculopathy, lumbar region: Principal | ICD-10-CM | POA: Diagnosis present

## 2020-01-17 HISTORY — PX: LUMBAR LAMINECTOMY/DECOMPRESSION MICRODISCECTOMY: SHX5026

## 2020-01-17 LAB — POCT PREGNANCY, URINE: Preg Test, Ur: NEGATIVE

## 2020-01-17 SURGERY — LUMBAR LAMINECTOMY/DECOMPRESSION MICRODISCECTOMY
Anesthesia: General

## 2020-01-17 MED ORDER — LIDOCAINE 2% (20 MG/ML) 5 ML SYRINGE
INTRAMUSCULAR | Status: AC
  Filled 2020-01-17: qty 5

## 2020-01-17 MED ORDER — 0.9 % SODIUM CHLORIDE (POUR BTL) OPTIME
TOPICAL | Status: DC | PRN
  Administered 2020-01-17: 13:00:00 1000 mL

## 2020-01-17 MED ORDER — PROPOFOL 10 MG/ML IV BOLUS
INTRAVENOUS | Status: DC | PRN
  Administered 2020-01-17: 30 mg via INTRAVENOUS
  Administered 2020-01-17: 150 mg via INTRAVENOUS

## 2020-01-17 MED ORDER — THROMBIN (RECOMBINANT) 20000 UNITS EX SOLR
CUTANEOUS | Status: AC
  Filled 2020-01-17: qty 20000

## 2020-01-17 MED ORDER — DEXAMETHASONE SODIUM PHOSPHATE 4 MG/ML IJ SOLN
INTRAMUSCULAR | Status: DC | PRN
  Administered 2020-01-17: 10 mg via INTRAVENOUS

## 2020-01-17 MED ORDER — METHOCARBAMOL 500 MG PO TABS: 500.0000 mg | ORAL_TABLET | Freq: Four times a day (QID) | ORAL | 0 refills | Status: DC | PRN

## 2020-01-17 MED ORDER — CEFAZOLIN SODIUM-DEXTROSE 2-4 GM/100ML-% IV SOLN
2.0000 g | INTRAVENOUS | Status: AC
  Administered 2020-01-17: 13:00:00 2 g via INTRAVENOUS
  Filled 2020-01-17: qty 100

## 2020-01-17 MED ORDER — MIDAZOLAM HCL 5 MG/5ML IJ SOLN
INTRAMUSCULAR | Status: DC | PRN
  Administered 2020-01-17: 2 mg via INTRAVENOUS

## 2020-01-17 MED ORDER — PHENOL 1.4 % MT LIQD: 1.0000 | OROMUCOSAL | Status: DC | PRN

## 2020-01-17 MED ORDER — OXYCODONE HCL 5 MG PO TABS
5.0000 mg | ORAL_TABLET | ORAL | Status: DC | PRN
  Administered 2020-01-17 – 2020-01-18 (×4): 5 mg via ORAL
  Filled 2020-01-17 (×5): qty 1

## 2020-01-17 MED ORDER — METHOCARBAMOL 500 MG PO TABS
ORAL_TABLET | ORAL | Status: AC
  Filled 2020-01-17: qty 1

## 2020-01-17 MED ORDER — MENTHOL 3 MG MT LOZG: 1.0000 | LOZENGE | OROMUCOSAL | Status: DC | PRN

## 2020-01-17 MED ORDER — EPHEDRINE SULFATE-NACL 50-0.9 MG/10ML-% IV SOSY
PREFILLED_SYRINGE | INTRAVENOUS | Status: DC | PRN
  Administered 2020-01-17: 10 mg via INTRAVENOUS

## 2020-01-17 MED ORDER — HYDROMORPHONE HCL 1 MG/ML IJ SOLN
0.2500 mg | INTRAMUSCULAR | Status: DC | PRN
Start: 2020-01-17 — End: 2020-01-17
  Administered 2020-01-17 (×2): 0.5 mg via INTRAVENOUS

## 2020-01-17 MED ORDER — SODIUM CHLORIDE 0.9 % IV SOLN: 250.0000 mL | INTRAVENOUS | Status: DC

## 2020-01-17 MED ORDER — ONDANSETRON HCL 4 MG/2ML IJ SOLN
INTRAMUSCULAR | Status: AC
  Filled 2020-01-17: qty 2

## 2020-01-17 MED ORDER — METHOCARBAMOL 1000 MG/10ML IJ SOLN
500.0000 mg | Freq: Four times a day (QID) | INTRAVENOUS | Status: DC | PRN
  Filled 2020-01-17: qty 5

## 2020-01-17 MED ORDER — METHOCARBAMOL 500 MG PO TABS
500.0000 mg | ORAL_TABLET | Freq: Four times a day (QID) | ORAL | Status: DC | PRN
  Administered 2020-01-17 – 2020-01-18 (×3): 500 mg via ORAL
  Filled 2020-01-17 (×2): qty 1

## 2020-01-17 MED ORDER — CHLORHEXIDINE GLUCONATE 0.12 % MT SOLN
15.0000 mL | Freq: Once | OROMUCOSAL | Status: AC
  Administered 2020-01-17: 11:00:00 15 mL via OROMUCOSAL
  Filled 2020-01-17: qty 15

## 2020-01-17 MED ORDER — MEPERIDINE HCL 25 MG/ML IJ SOLN: 6.2500 mg | INTRAMUSCULAR | Status: DC | PRN

## 2020-01-17 MED ORDER — HYDROMORPHONE HCL 1 MG/ML IJ SOLN
INTRAMUSCULAR | Status: AC
  Filled 2020-01-17: qty 1

## 2020-01-17 MED ORDER — LIDOCAINE 2% (20 MG/ML) 5 ML SYRINGE
INTRAMUSCULAR | Status: DC | PRN
  Administered 2020-01-17: 20 mg via INTRAVENOUS
  Administered 2020-01-17: 80 mg via INTRAVENOUS

## 2020-01-17 MED ORDER — BUPIVACAINE HCL (PF) 0.25 % IJ SOLN
INTRAMUSCULAR | Status: AC
  Filled 2020-01-17: qty 30

## 2020-01-17 MED ORDER — FENTANYL CITRATE (PF) 250 MCG/5ML IJ SOLN
INTRAMUSCULAR | Status: AC
  Filled 2020-01-17: qty 5

## 2020-01-17 MED ORDER — ACETAMINOPHEN 325 MG PO TABS
650.0000 mg | ORAL_TABLET | ORAL | Status: DC | PRN
  Administered 2020-01-18: 650 mg via ORAL
  Filled 2020-01-17: qty 2

## 2020-01-17 MED ORDER — ROCURONIUM BROMIDE 10 MG/ML (PF) SYRINGE
PREFILLED_SYRINGE | INTRAVENOUS | Status: AC
  Filled 2020-01-17: qty 10

## 2020-01-17 MED ORDER — DEXAMETHASONE SODIUM PHOSPHATE 10 MG/ML IJ SOLN
INTRAMUSCULAR | Status: AC
  Filled 2020-01-17: qty 1

## 2020-01-17 MED ORDER — OXYCODONE-ACETAMINOPHEN 5-325 MG PO TABS: 1.0000 | ORAL_TABLET | ORAL | 0 refills | Status: DC | PRN

## 2020-01-17 MED ORDER — ONDANSETRON HCL 4 MG PO TABS: 4.0000 mg | ORAL_TABLET | Freq: Four times a day (QID) | ORAL | Status: DC | PRN

## 2020-01-17 MED ORDER — OXYCODONE HCL 5 MG/5ML PO SOLN: 5.0000 mg | Freq: Once | ORAL | Status: DC | PRN

## 2020-01-17 MED ORDER — MIDAZOLAM HCL 2 MG/2ML IJ SOLN
INTRAMUSCULAR | Status: AC
  Filled 2020-01-17: qty 2

## 2020-01-17 MED ORDER — SODIUM CHLORIDE 0.9 % IV SOLN: INTRAVENOUS | Status: DC

## 2020-01-17 MED ORDER — OXYCODONE HCL 5 MG PO TABS: 5.0000 mg | ORAL_TABLET | Freq: Once | ORAL | Status: DC | PRN

## 2020-01-17 MED ORDER — ONDANSETRON HCL 4 MG/2ML IJ SOLN: 4.0000 mg | Freq: Four times a day (QID) | INTRAMUSCULAR | Status: DC | PRN

## 2020-01-17 MED ORDER — SODIUM CHLORIDE 0.9% FLUSH
3.0000 mL | Freq: Two times a day (BID) | INTRAVENOUS | Status: DC
  Administered 2020-01-17: 22:00:00 3 mL via INTRAVENOUS

## 2020-01-17 MED ORDER — FENTANYL CITRATE (PF) 100 MCG/2ML IJ SOLN
INTRAMUSCULAR | Status: DC | PRN
  Administered 2020-01-17 (×3): 50 ug via INTRAVENOUS
  Administered 2020-01-17 (×2): 100 ug via INTRAVENOUS

## 2020-01-17 MED ORDER — LACTATED RINGERS IV SOLN: INTRAVENOUS | Status: DC

## 2020-01-17 MED ORDER — SODIUM CHLORIDE 0.9% FLUSH: 3.0000 mL | INTRAVENOUS | Status: DC | PRN

## 2020-01-17 MED ORDER — PHENYLEPHRINE 40 MCG/ML (10ML) SYRINGE FOR IV PUSH (FOR BLOOD PRESSURE SUPPORT)
PREFILLED_SYRINGE | INTRAVENOUS | Status: DC | PRN
  Administered 2020-01-17: 80 ug via INTRAVENOUS
  Administered 2020-01-17: 120 ug via INTRAVENOUS

## 2020-01-17 MED ORDER — MIDAZOLAM HCL 2 MG/2ML IJ SOLN
0.5000 mg | Freq: Once | INTRAMUSCULAR | Status: DC | PRN
Start: 2020-01-17 — End: 2020-01-17

## 2020-01-17 MED ORDER — ROCURONIUM BROMIDE 10 MG/ML (PF) SYRINGE
PREFILLED_SYRINGE | INTRAVENOUS | Status: DC | PRN
  Administered 2020-01-17: 60 mg via INTRAVENOUS
  Administered 2020-01-17: 40 mg via INTRAVENOUS

## 2020-01-17 MED ORDER — PROPOFOL 10 MG/ML IV BOLUS
INTRAVENOUS | Status: AC
  Filled 2020-01-17: qty 20

## 2020-01-17 MED ORDER — PROMETHAZINE HCL 25 MG/ML IJ SOLN: 6.2500 mg | INTRAMUSCULAR | Status: DC | PRN

## 2020-01-17 MED ORDER — HYDROMORPHONE HCL 1 MG/ML IJ SOLN
0.0000 mg | INTRAMUSCULAR | Status: DC | PRN
Start: 2020-01-17 — End: 2020-01-18
  Administered 2020-01-17: 1 mg via INTRAVENOUS
  Filled 2020-01-17: qty 1

## 2020-01-17 MED ORDER — ACETAMINOPHEN 650 MG RE SUPP: 650.0000 mg | RECTAL | Status: DC | PRN

## 2020-01-17 MED ORDER — ORAL CARE MOUTH RINSE: 15.0000 mL | Freq: Once | OROMUCOSAL | Status: AC

## 2020-01-17 MED ORDER — ONDANSETRON HCL 4 MG/2ML IJ SOLN
INTRAMUSCULAR | Status: DC | PRN
  Administered 2020-01-17: 4 mg via INTRAVENOUS

## 2020-01-17 MED ORDER — BUPIVACAINE HCL (PF) 0.25 % IJ SOLN
INTRAMUSCULAR | Status: DC | PRN
  Administered 2020-01-17: 30 mL

## 2020-01-17 SURGICAL SUPPLY — 45 items
AGENT HMST KT MTR STRL THRMB (HEMOSTASIS) ×1
BUR ROUND FLUTED 4 SOFT TCH (BURR) ×1 IMPLANT
BUR ROUND FLUTED 4MM SOFT TCH (BURR) ×1
CANISTER SUCT 3000ML PPV (MISCELLANEOUS) ×3 IMPLANT
CLOSURE STERI-STRIP 1/2X4 (GAUZE/BANDAGES/DRESSINGS) ×1
CLSR STERI-STRIP ANTIMIC 1/2X4 (GAUZE/BANDAGES/DRESSINGS) ×2 IMPLANT
COVER SURGICAL LIGHT HANDLE (MISCELLANEOUS) ×3 IMPLANT
COVER WAND RF STERILE (DRAPES) ×3 IMPLANT
DECANTER SPIKE VIAL GLASS SM (MISCELLANEOUS) ×3 IMPLANT
DRAPE HALF SHEET 40X57 (DRAPES) ×6 IMPLANT
DRAPE MICROSCOPE LEICA (MISCELLANEOUS) ×3 IMPLANT
DRAPE SURG 17X23 STRL (DRAPES) ×3 IMPLANT
DRSG MEPILEX BORDER 4X4 (GAUZE/BANDAGES/DRESSINGS) ×3 IMPLANT
DRSG MEPILEX BORDER 4X8 (GAUZE/BANDAGES/DRESSINGS) ×2 IMPLANT
DURAPREP 26ML APPLICATOR (WOUND CARE) ×3 IMPLANT
ELECT REM PT RETURN 9FT ADLT (ELECTROSURGICAL) ×3
ELECTRODE REM PT RTRN 9FT ADLT (ELECTROSURGICAL) ×1 IMPLANT
GLOVE BIOGEL PI IND STRL 8 (GLOVE) ×2 IMPLANT
GLOVE BIOGEL PI INDICATOR 8 (GLOVE) ×4
GLOVE ORTHO TXT STRL SZ7.5 (GLOVE) ×6 IMPLANT
GOWN STRL REUS W/ TWL LRG LVL3 (GOWN DISPOSABLE) ×2 IMPLANT
GOWN STRL REUS W/ TWL XL LVL3 (GOWN DISPOSABLE) ×1 IMPLANT
GOWN STRL REUS W/TWL 2XL LVL3 (GOWN DISPOSABLE) ×3 IMPLANT
GOWN STRL REUS W/TWL LRG LVL3 (GOWN DISPOSABLE) ×6
GOWN STRL REUS W/TWL XL LVL3 (GOWN DISPOSABLE) ×3
KIT BASIN OR (CUSTOM PROCEDURE TRAY) ×3 IMPLANT
KIT TURNOVER KIT B (KITS) ×3 IMPLANT
MANIFOLD NEPTUNE II (INSTRUMENTS) ×3 IMPLANT
NDL HYPO 25GX1X1/2 BEV (NEEDLE) ×1 IMPLANT
NDL SPNL 18GX3.5 QUINCKE PK (NEEDLE) ×1 IMPLANT
NEEDLE HYPO 25GX1X1/2 BEV (NEEDLE) ×3 IMPLANT
NEEDLE SPNL 18GX3.5 QUINCKE PK (NEEDLE) ×3 IMPLANT
NS IRRIG 1000ML POUR BTL (IV SOLUTION) ×3 IMPLANT
PACK LAMINECTOMY ORTHO (CUSTOM PROCEDURE TRAY) ×3 IMPLANT
PAD ARMBOARD 7.5X6 YLW CONV (MISCELLANEOUS) ×6 IMPLANT
PATTIES SURGICAL .5 X.5 (GAUZE/BANDAGES/DRESSINGS) ×2 IMPLANT
PATTIES SURGICAL .75X.75 (GAUZE/BANDAGES/DRESSINGS) ×2 IMPLANT
SURGIFLO W/THROMBIN 8M KIT (HEMOSTASIS) ×2 IMPLANT
SUT VIC AB 1 CTX 36 (SUTURE) ×6
SUT VIC AB 1 CTX36XBRD ANBCTR (SUTURE) ×1 IMPLANT
SUT VIC AB 2-0 CT1 27 (SUTURE) ×3
SUT VIC AB 2-0 CT1 TAPERPNT 27 (SUTURE) ×1 IMPLANT
SUT VIC AB 3-0 X1 27 (SUTURE) ×3 IMPLANT
TOWEL GREEN STERILE (TOWEL DISPOSABLE) ×3 IMPLANT
TOWEL GREEN STERILE FF (TOWEL DISPOSABLE) ×3 IMPLANT

## 2020-01-17 NOTE — Anesthesia Procedure Notes (Signed)
Procedure Name: Intubation Date/Time: 01/17/2020 12:40 PM Performed by: Montez Morita, Briann Sarchet W, CRNA Pre-anesthesia Checklist: Patient identified, Emergency Drugs available, Suction available and Patient being monitored Patient Re-evaluated:Patient Re-evaluated prior to induction Oxygen Delivery Method: Circle system utilized Preoxygenation: Pre-oxygenation with 100% oxygen Induction Type: IV induction Ventilation: Mask ventilation without difficulty Laryngoscope Size: Miller and 2 Grade View: Grade I Tube type: Oral Tube size: 7.0 mm Number of attempts: 1 Airway Equipment and Method: Stylet Placement Confirmation: ETT inserted through vocal cords under direct vision,  positive ETCO2 and breath sounds checked- equal and bilateral Secured at: 23 cm Tube secured with: Tape Dental Injury: Teeth and Oropharynx as per pre-operative assessment

## 2020-01-17 NOTE — Discharge Instructions (Addendum)
Ok to shower 1 days postop.  Do not apply any creams or ointments to incision.  Can use 4x4 gauze and tape for dressing changes if your dressing comes off.It is waterproof and you can leave it on and shower.    No aggressive activity.  No twisting, bending, squatting or prolonged sitting.  Mostly be in reclined position or lying down.    No driving. Walk daily slowly working up to 2 miles a day over several weeks.

## 2020-01-17 NOTE — Op Note (Signed)
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  L5-S1 large left HNP.  Postop diagnosis: Same  Procedure: Bilateral microdiscectomy L4-5.  Removal of free fragments.  Left L5 hemilaminectomy.  Left L5-S1 microdiscectomy.  (Bilateral microdiscectomy at L4-5, unilateral microdiscectomy on the left at L5-S1)  Surgeon: Annell Greening, MD  Assistant: Zonia Kief, PA-C medically necessary and present for the entire procedure  EBL: 100 cc.  Findings: Very large disc herniation centrally at L4-5 with extruded fragment caudally.  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  Prepping with DuraPrep 1010 drape was applied at S1 are slightly caudally to S1.  Area squared with towels Betadine Steri-Drape application laminectomy sheets and drapes.  Spinal needle after timeout procedure was used to confirm positioning adjusted repeat x-ray taken skin was marked.  Midline incision was made subperiosteal dissection was performed.  Bilateral exposure was made at L4-5 level due to the extremely large disc herniation with severe stenosis.  Left exposure at L5-S1 was performed.  Kocher clamps were placed aiming at the disc at L4-5 and also L5-S1 confirmed with a second image and the bone was marked.  Spinous process portion inferior aspect of L4 was removed top portion of L5 and hemilaminectomy was performed on the left taking the L5 lamina and laminotomy at L4.  L4-5 level expose first due to the large disc herniation and multiple passes were made taking chunks of disc there was some extruded fragment retained by the ligament and the ligament had to be opened and sized.  Normal nerve root was dorsally displaced and there is enough tightness that we  removed all the ligament centrally and one over the opposite right side performed microdiscectomy at the side with less disc material.  Most of the large central disc was able to be teased down in the disc space using the Roberts, hockey-stick, large Epstein curettes and then delivered with standard straight pituitary micropituitary's straight and up-biting micropituitary's.  Hockey-stick could be passed anterior to the dura without areas of compression.  Nerve root was decompressed at the level of the pedicle.  We passed caudally to the nerve root and expose the L5-S1 disc space where there was large left paracentral disc herniation chunks were removed microdiscectomy was performed using Epstein curettes pituitaries.  Once there was good decompression foraminal was enlarged bone was removed the pedicle copious irrigation epidural space was controlled with the bipolar cautery and some Surgi-Flo and small patties.  Final passes were made to make sure all disc fragments had been removed we checked in the axilla of the nerve root for L5 as well as over the shoulder at L5 4 5 disc space level feeling above and caudally to make sure there are no remaining fragments feeling from the opposite right side at L4-5 and then also at L5-S1 from the left side.  No remaining no compression was present copious irrigation.  Fascial layer was closed with #1 interrupted Vicryl sutures 2-0 Vicryl subtenons tissue skin closure postop dressing and transfer the care room in stable condition.

## 2020-01-17 NOTE — Anesthesia Preprocedure Evaluation (Signed)
Anesthesia Evaluation  Patient identified by MRN, date of birth, ID band Patient awake    Reviewed: Allergy & Precautions, NPO status , Patient's Chart, lab work & pertinent test results  History of Anesthesia Complications Negative for: history of anesthetic complications  Airway Mallampati: II  TM Distance: >3 FB Neck ROM: Full    Dental  (+) Dental Advisory Given, Missing   Pulmonary Current SmokerPatient did not abstain from smoking.,  01/13/2020 SARS coronavirus NEG   breath sounds clear to auscultation       Cardiovascular negative cardio ROS   Rhythm:Regular Rate:Normal     Neuro/Psych Chronic back pain: narcotics    GI/Hepatic negative GI ROS, Neg liver ROS,   Endo/Other  negative endocrine ROS  Renal/GU negative Renal ROS     Musculoskeletal   Abdominal   Peds  Hematology  (+) Blood dyscrasia (Hb 10.6), anemia ,   Anesthesia Other Findings   Reproductive/Obstetrics                             Anesthesia Physical Anesthesia Plan  ASA: II  Anesthesia Plan: General   Post-op Pain Management:    Induction: Intravenous  PONV Risk Score and Plan: 2 and Ondansetron and Dexamethasone  Airway Management Planned: Oral ETT  Additional Equipment: None  Intra-op Plan:   Post-operative Plan: Extubation in OR  Informed Consent: I have reviewed the patients History and Physical, chart, labs and discussed the procedure including the risks, benefits and alternatives for the proposed anesthesia with the patient or authorized representative who has indicated his/her understanding and acceptance.     Dental advisory given  Plan Discussed with: CRNA and Surgeon  Anesthesia Plan Comments:         Anesthesia Quick Evaluation

## 2020-01-17 NOTE — Interval H&P Note (Signed)
History and Physical Interval Note:  01/17/2020 12:21 PM  Carolyn Summers  has presented today for surgery, with the diagnosis of L4-5, L5-S1 herniated nucleus pulposus.  The various methods of treatment have been discussed with the patient and family. After consideration of risks, benefits and other options for treatment, the patient has consented to  Procedure(s): L4-5, L5-S1  MICRODISCECTOMY (N/A) as a surgical intervention.  The patient's history has been reviewed, patient examined, no change in status, stable for surgery.  I have reviewed the patient's chart and labs.  Questions were answered to the patient's satisfaction.     Eldred Manges
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
## 2020-01-17 NOTE — Anesthesia Postprocedure Evaluation (Signed)
Anesthesia Post Note  Patient: Carolyn Summers  Procedure(s) Performed: L4-5, L5-S1  MICRODISCECTOMY (N/A )     Patient location during evaluation: PACU Anesthesia Type: General Level of consciousness: sedated Pain management: pain level controlled Vital Signs Assessment: post-procedure vital signs reviewed and stable Respiratory status: spontaneous breathing and respiratory function stable Cardiovascular status: stable Postop Assessment: no apparent nausea or vomiting Anesthetic complications: no   No complications documented.  Last Vitals:  Vitals:   01/17/20 1538 01/17/20 1553  BP: 138/90 134/80  Pulse: 84 76  Resp: 16 16  Temp: 36.9 C 36.9 C  SpO2: 100% 100%    Last Pain:  Vitals:   01/17/20 1553  TempSrc:   PainSc: 4                  Damesha Lawler DANIEL
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
## 2020-01-17 NOTE — Transfer of Care (Signed)
Immediate Anesthesia Transfer of Care Note  Patient: Carolyn Summers  Procedure(s) Performed: L4-5, L5-S1  MICRODISCECTOMY (N/A )  Patient Location: PACU  Anesthesia Type:General  Level of Consciousness: awake, alert , oriented and patient cooperative  Airway & Oxygen Therapy: Patient Spontanous Breathing and Patient connected to face mask oxygen  Post-op Assessment: Report given to RN and Post -op Vital signs reviewed and stable  Post vital signs: Reviewed and stable  Last Vitals:  Vitals Value Taken Time  BP 130/81 01/17/20 1507  Temp    Pulse 89 01/17/20 1508  Resp 22 01/17/20 1508  SpO2 100 % 01/17/20 1508  Vitals shown include unvalidated device data.  Last Pain:  Vitals:   01/17/20 1035  TempSrc: Oral  PainSc: 5       Patients Stated Pain Goal: 3 (01/17/20 1035)  Complications: No complications documented.

## 2020-01-18 ENCOUNTER — Encounter (HOSPITAL_COMMUNITY): Payer: Self-pay | Admitting: Orthopaedic Surgery

## 2020-01-18 NOTE — Progress Notes (Signed)
Discharged instructions/education/AVS/Rx given to patient and verbalized understanding, all questions were answered appropriately and patient is agreeable. Pain is mild to moderate and controlled by PRN meds. Patient ambulating well with supervision. Patient voiding and emptying bladder well. No drainage, no swelling, no redness noted on incision site, dressing changed as ordered by MD. Discharged patient via wheelchair.

## 2020-01-18 NOTE — Progress Notes (Signed)
   Subjective: 1 Day Post-Op Procedure(s) (LRB): L4-5, L5-S1  MICRODISCECTOMY (N/A) Patient reports pain as moderate.  Back pain incisional pain. Has walked the halls.   Objective: Vital signs in last 24 hours: Temp:  [98 F (36.7 C)-98.9 F (37.2 C)] 98.9 F (37.2 C) (12/28 0412) Pulse Rate:  [69-95] 81 (12/28 0412) Resp:  [10-20] 20 (12/28 0412) BP: (124-143)/(73-90) 130/76 (12/28 0412) SpO2:  [99 %-100 %] 100 % (12/28 0412) Weight:  [91.2 kg] 91.2 kg (12/27 1035)  Intake/Output from previous day: 12/27 0701 - 12/28 0700 In: 1425 [P.O.:150; I.V.:1100] Out: 100 [Blood:100] Intake/Output this shift: No intake/output data recorded.  No results for input(s): HGB in the last 72 hours. No results for input(s): WBC, RBC, HCT, PLT in the last 72 hours. No results for input(s): NA, K, CL, CO2, BUN, CREATININE, GLUCOSE, CALCIUM in the last 72 hours. No results for input(s): LABPT, INR in the last 72 hours.  Neurologically intact DG Lumbar Spine 2-3 Views  Result Date: 01/17/2020 CLINICAL DATA:  L4-S1 fusion. EXAM: LUMBAR SPINE - 2-3 VIEW COMPARISON:  Lumbar radiographs December 01, 2019. MRI December 24, 2019. FINDINGS: Two intraoperative radiographs. These images demonstrates surgical instruments in the posterior paraspinal soft tissues at the L5-S1 and S1-S2 levels. IMPRESSION: Intraoperative radiographs, as detailed above. Electronically Signed   By: Feliberto Harts MD   On: 01/17/2020 16:00    Assessment/Plan: 1 Day Post-Op Procedure(s) (LRB): L4-5, L5-S1  MICRODISCECTOMY (N/A) Up with therapy, discharge home. Office one week  Eldred Manges 01/18/2020, 7:45 AM
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
## 2020-01-25 ENCOUNTER — Other Ambulatory Visit: Payer: Self-pay

## 2020-01-25 ENCOUNTER — Encounter: Payer: Self-pay | Admitting: Orthopaedic Surgery

## 2020-01-25 ENCOUNTER — Ambulatory Visit (INDEPENDENT_AMBULATORY_CARE_PROVIDER_SITE_OTHER): Payer: Self-pay | Admitting: Orthopaedic Surgery

## 2020-01-25 DIAGNOSIS — Z9889 Other specified postprocedural states: Secondary | ICD-10-CM | POA: Insufficient documentation

## 2020-01-25 MED ORDER — OXYCODONE-ACETAMINOPHEN 5-325 MG PO TABS: 1.0000 | ORAL_TABLET | ORAL | 0 refills | Status: DC | PRN

## 2020-01-25 MED ORDER — METHOCARBAMOL 500 MG PO TABS: 500.0000 mg | ORAL_TABLET | Freq: Three times a day (TID) | ORAL | 0 refills | Status: DC | PRN
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
## 2020-02-01 ENCOUNTER — Other Ambulatory Visit: Payer: Self-pay

## 2020-02-01 ENCOUNTER — Encounter: Payer: Self-pay | Admitting: Orthopaedic Surgery

## 2020-02-01 ENCOUNTER — Ambulatory Visit (INDEPENDENT_AMBULATORY_CARE_PROVIDER_SITE_OTHER): Payer: Self-pay | Admitting: Orthopaedic Surgery

## 2020-02-01 VITALS — Ht 69.0 in | Wt 201.0 lb

## 2020-02-01 DIAGNOSIS — Z9889 Other specified postprocedural states: Secondary | ICD-10-CM

## 2020-02-01 MED ORDER — METHOCARBAMOL 500 MG PO TABS: 500.0000 mg | ORAL_TABLET | Freq: Three times a day (TID) | ORAL | 0 refills | Status: DC | PRN

## 2020-02-01 MED ORDER — HYDROCODONE-ACETAMINOPHEN 5-325 MG PO TABS: 1.0000 | ORAL_TABLET | Freq: Four times a day (QID) | ORAL | 0 refills | Status: DC | PRN
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
## 2020-02-08 ENCOUNTER — Telehealth: Payer: Self-pay

## 2020-02-08 ENCOUNTER — Other Ambulatory Visit: Payer: Self-pay | Admitting: Orthopaedic Surgery

## 2020-02-08 MED ORDER — HYDROCODONE-ACETAMINOPHEN 5-325 MG PO TABS: 1.0000 | ORAL_TABLET | Freq: Four times a day (QID) | ORAL | 0 refills | Status: AC | PRN

## 2020-02-08 NOTE — Telephone Encounter (Signed)
Pt called asking if she can have a refill on her hydrocodone

## 2020-02-08 NOTE — Telephone Encounter (Signed)
I sent in # 30 tabs. [facility]. Let her know this is final Rx, then need to be off so she can use it sparingly.

## 2020-02-08 NOTE — Telephone Encounter (Signed)
Please advise 

## 2020-02-08 NOTE — Telephone Encounter (Signed)
Patient aware of the below message  

## 2020-02-08 NOTE — Progress Notes (Signed)
Sent in norco 5/325 # 30 . Last rx for post op pain.

## 2020-02-17 ENCOUNTER — Telehealth: Payer: Self-pay | Admitting: Orthopaedic Surgery

## 2020-02-17 MED ORDER — METHOCARBAMOL 500 MG PO TABS: 500.0000 mg | ORAL_TABLET | Freq: Three times a day (TID) | ORAL | 0 refills | Status: DC | PRN

## 2020-02-17 NOTE — Telephone Encounter (Signed)
Ok refill robaxin thanks 

## 2020-02-17 NOTE — Telephone Encounter (Signed)
Patient called advised she could not pick up her Rx (Methocarbamol)  Patient said she had to get her pain medicine first. Patient said the Rx for the Methocarbamol has expired. Patient asked if the Rx can be called in again to the pharmacy?  The number to contact patient is [phone]

## 2020-02-17 NOTE — Telephone Encounter (Signed)
Please advise 

## 2020-02-17 NOTE — Telephone Encounter (Signed)
rx sent to pharmacy

## 2020-02-22 ENCOUNTER — Telehealth: Payer: Self-pay | Admitting: Orthopaedic Surgery

## 2020-02-22 ENCOUNTER — Other Ambulatory Visit: Payer: Self-pay | Admitting: Orthopaedic Surgery

## 2020-02-22 MED ORDER — TRAMADOL HCL 50 MG PO TABS: 50.0000 mg | ORAL_TABLET | Freq: Two times a day (BID) | ORAL | 0 refills | Status: DC | PRN

## 2020-02-22 NOTE — Telephone Encounter (Signed)
Patient called advised she is still in a lot of pain and would like to know why her pain medicine was stopped (Hydrocodone)?   The number to contact patient is [phone]

## 2020-02-22 NOTE — Telephone Encounter (Signed)
Please advise 

## 2020-02-22 NOTE — Telephone Encounter (Signed)
Send in ultram # 40   one po bid prn pain . [facility]  [address].

## 2020-02-22 NOTE — Telephone Encounter (Signed)
Called to pharmacy 

## 2020-02-29 ENCOUNTER — Ambulatory Visit: Payer: Medicaid Other | Admitting: Orthopaedic Surgery

## 2020-03-07 ENCOUNTER — Ambulatory Visit (INDEPENDENT_AMBULATORY_CARE_PROVIDER_SITE_OTHER): Payer: Self-pay | Admitting: Orthopaedic Surgery

## 2020-03-07 VITALS — BP 134/84 | HR 92

## 2020-03-07 DIAGNOSIS — Z9889 Other specified postprocedural states: Secondary | ICD-10-CM

## 2020-03-07 MED ORDER — TRAMADOL HCL 50 MG PO TABS: 50.0000 mg | ORAL_TABLET | Freq: Two times a day (BID) | ORAL | 0 refills | Status: AC | PRN

## 2020-03-07 NOTE — Addendum Note (Signed)
Addended byPrescott Parma on: 03/07/2020 10:06 AM   Modules accepted: Orders
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
## 2020-03-20 ENCOUNTER — Ambulatory Visit: Payer: Self-pay | Admitting: Physical Therapy

## 2020-03-31 ENCOUNTER — Ambulatory Visit: Payer: Self-pay | Admitting: Rehabilitative and Restorative Service Providers"

## 2020-04-20 ENCOUNTER — Ambulatory Visit: Payer: Self-pay | Admitting: Physical Therapy

## 2021-10-14 NOTE — Progress Notes (Signed)
Patient with history of L4-5 and L5-S1 HNP comes in for preop evaluation.  Symptoms unchanged in previous visit and she is wanting to proceed with L4-5 and L5-S1 microdiscectomy as scheduled.  Surgical procedure discussed.

## 2022-07-30 ENCOUNTER — Ambulatory Visit: Payer: No Typology Code available for payment source | Admitting: Orthopaedic Surgery

## 2022-08-15 IMAGING — CR DG HIP (WITH OR WITHOUT PELVIS) 2-3V*R*
3 series · 3 of 3 positions shown · non-contrast
Comparison: Right hip radiographs 07/08/2019

CLINICAL DATA: Pt presents with c/o right lateral hip pain for
approx 8 months. Pt was seen here on Halloween for same and reports
the medicine she is taking is not helping.

EXAM:
DG HIP (WITH OR WITHOUT PELVIS) 2-3V RIGHT

[t pelvis ap]
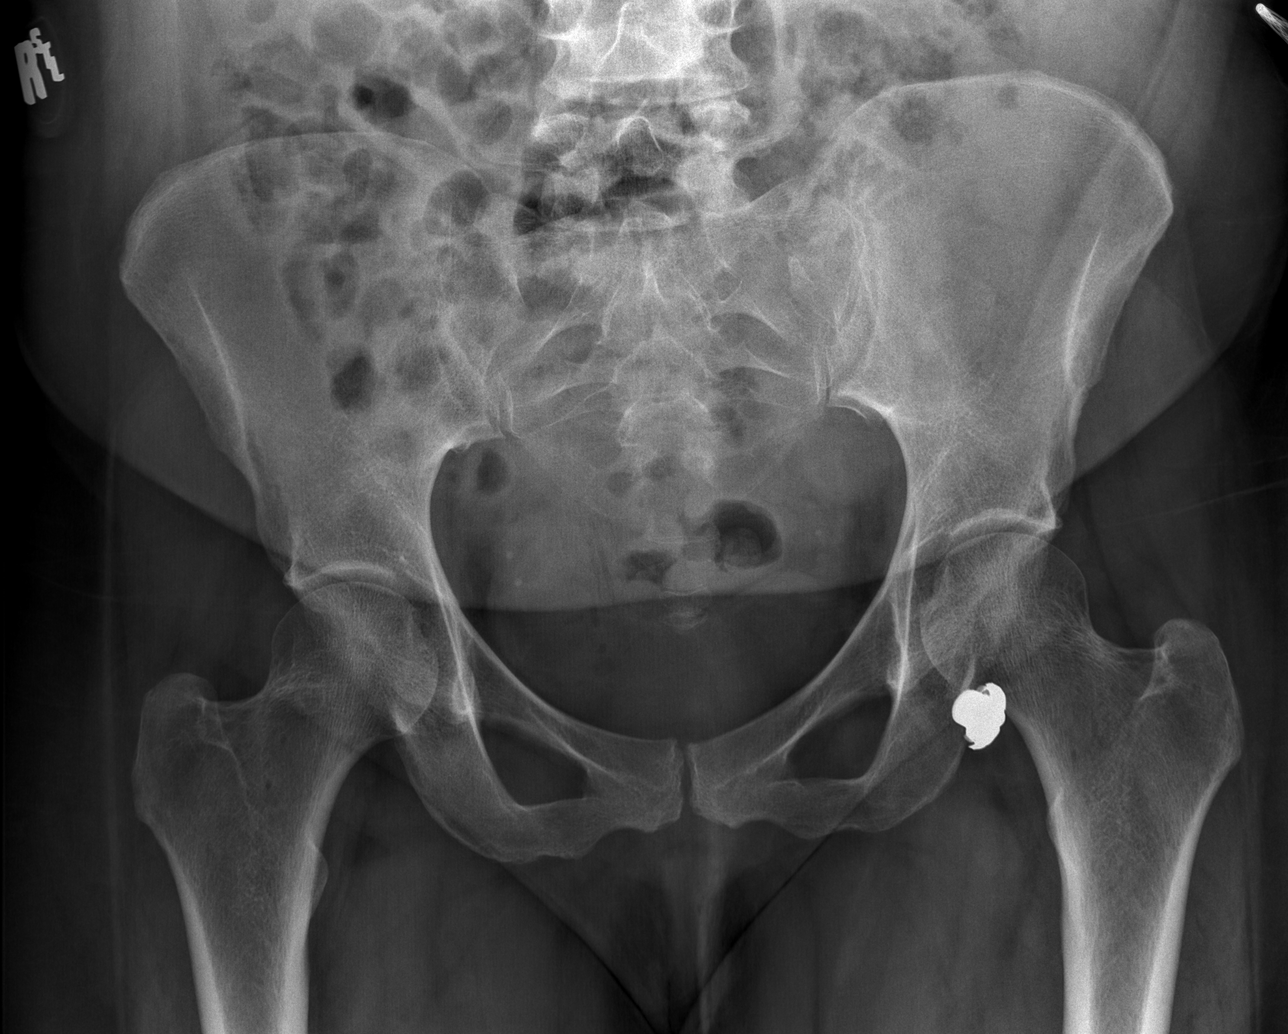

[t hip ap right]
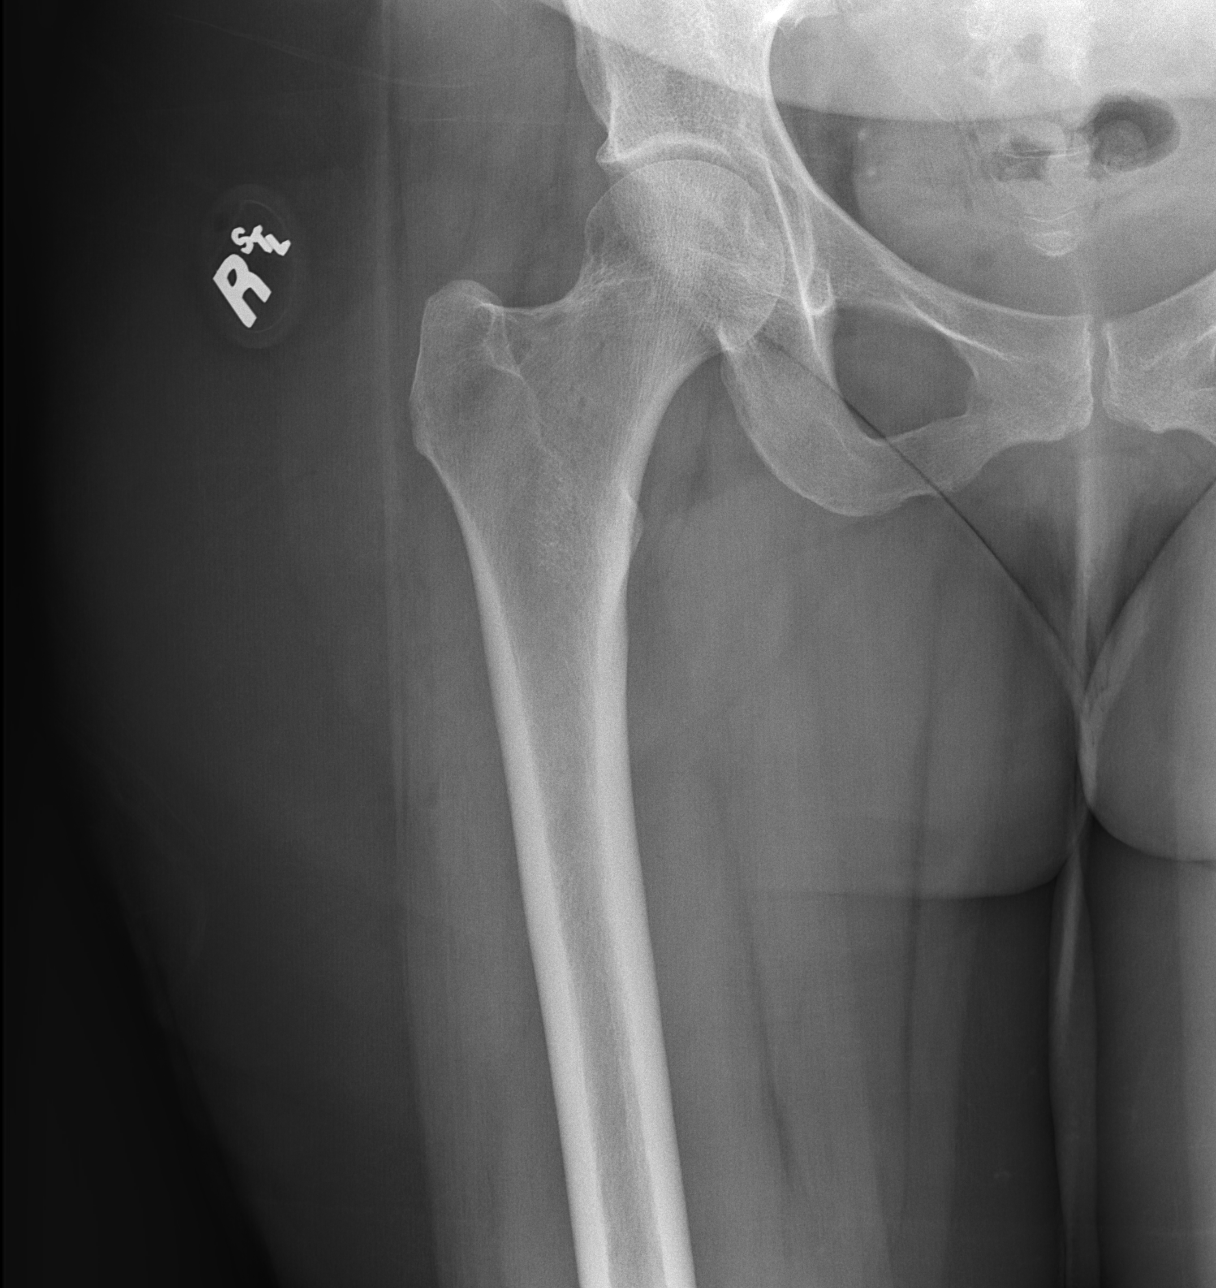

[t hip frog leg right]
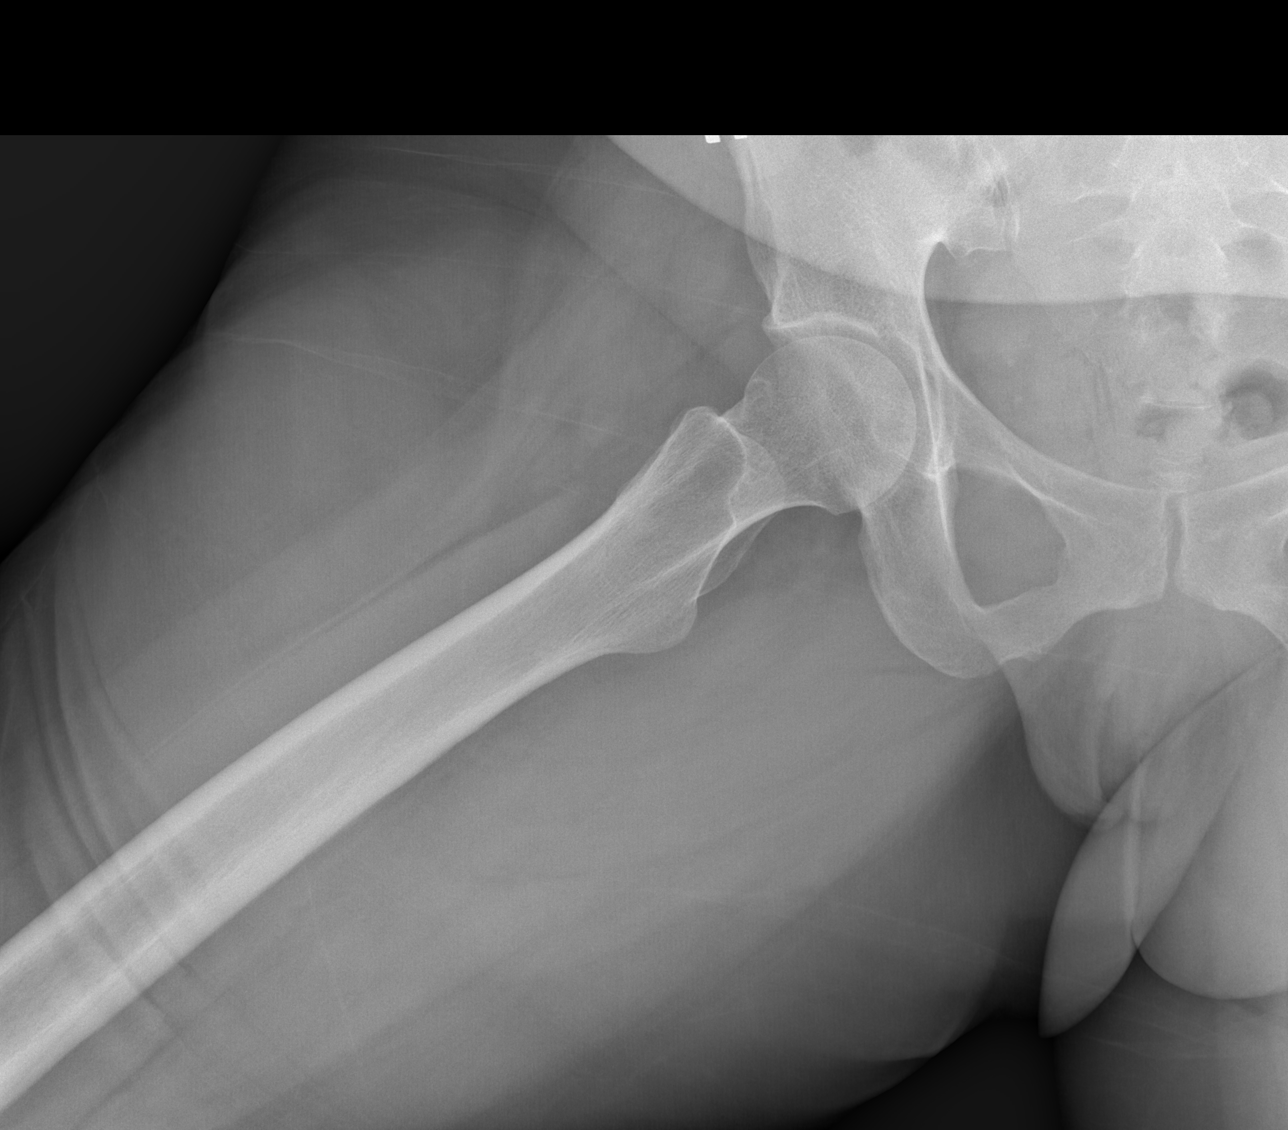

[3 of 3 positions shown; findings below may reference images not displayed]

FINDINGS: No evidence of acute fracture or dislocation. No avascular necrosis
or other destructive lesion. No significant degenerative changes.
Pelvic ring is intact. Regional soft tissues are unremarkable.
IMPRESSION: Negative right hip radiographs.

## 2022-09-19 ENCOUNTER — Ambulatory Visit: Payer: No Typology Code available for payment source | Admitting: Physician Assistant

## 2023-07-17 ENCOUNTER — Encounter: Admitting: Physician Assistant

## 2023-10-23 ENCOUNTER — Encounter: Admitting: Physician Assistant

## 2023-12-21 ENCOUNTER — Other Ambulatory Visit: Payer: Self-pay

## 2023-12-21 ENCOUNTER — Emergency Department (HOSPITAL_COMMUNITY)
Admission: EM | Admit: 2023-12-21 | Discharge: 2023-12-21 | Disposition: A | Discharge status: Home / Self Care | Payer: MEDICAID

## 2023-12-21 ENCOUNTER — Emergency Department (HOSPITAL_COMMUNITY): Payer: MEDICAID

## 2023-12-21 DIAGNOSIS — W108XXA Fall (on) (from) other stairs and steps, initial encounter: Secondary | ICD-10-CM | POA: Diagnosis not present

## 2023-12-21 DIAGNOSIS — M545 Low back pain, unspecified: Secondary | ICD-10-CM | POA: Insufficient documentation

## 2023-12-21 DIAGNOSIS — M5442 Lumbago with sciatica, left side: Secondary | ICD-10-CM

## 2023-12-21 LAB — POC URINE PREG, ED: Preg Test, Ur: NEGATIVE

## 2023-12-21 MED ORDER — METHOCARBAMOL 500 MG PO TABS
500.0000 mg | ORAL_TABLET | Freq: Once | ORAL | Status: AC
  Administered 2023-12-21: 500 mg via ORAL
  Filled 2023-12-21: qty 1

## 2023-12-21 MED ORDER — OXYCODONE-ACETAMINOPHEN 5-325 MG PO TABS: 1.0000 | ORAL_TABLET | Freq: Four times a day (QID) | ORAL | 0 refills | Status: DC | PRN

## 2023-12-21 MED ORDER — OXYCODONE-ACETAMINOPHEN 5-325 MG PO TABS: 1.0000 | ORAL_TABLET | Freq: Four times a day (QID) | ORAL | 0 refills | Status: AC | PRN

## 2023-12-21 MED ORDER — METHOCARBAMOL 500 MG PO TABS: 500.0000 mg | ORAL_TABLET | Freq: Two times a day (BID) | ORAL | 0 refills | Status: DC

## 2023-12-21 MED ORDER — OXYCODONE HCL 5 MG PO TABS
5.0000 mg | ORAL_TABLET | Freq: Once | ORAL | Status: AC
  Administered 2023-12-21: 5 mg via ORAL
  Filled 2023-12-21: qty 1

## 2023-12-21 MED ORDER — METHOCARBAMOL 500 MG PO TABS: 500.0000 mg | ORAL_TABLET | Freq: Two times a day (BID) | ORAL | 0 refills | Status: AC

## 2023-12-21 MED ORDER — DEXAMETHASONE 4 MG PO TABS
4.0000 mg | ORAL_TABLET | Freq: Once | ORAL | Status: AC
  Administered 2023-12-21: 4 mg via ORAL
  Filled 2023-12-21: qty 1

## 2023-12-21 MED ORDER — LIDOCAINE 5 % EX PTCH
1.0000 | MEDICATED_PATCH | Freq: Once | CUTANEOUS | Status: DC
  Administered 2023-12-21: 1 via TRANSDERMAL
  Filled 2023-12-21: qty 1

## 2023-12-21 MED ORDER — ACETAMINOPHEN 500 MG PO TABS
1000.0000 mg | ORAL_TABLET | Freq: Once | ORAL | Status: AC
  Administered 2023-12-21: 1000 mg via ORAL
  Filled 2023-12-21: qty 2

## 2023-12-21 MED ORDER — KETOROLAC TROMETHAMINE 15 MG/ML IJ SOLN
15.0000 mg | Freq: Once | INTRAMUSCULAR | Status: AC
  Administered 2023-12-21: 15 mg via INTRAVENOUS
  Filled 2023-12-21: qty 1

## 2023-12-21 MED ORDER — LIDOCAINE 5 % EX PTCH: 1.0000 | MEDICATED_PATCH | CUTANEOUS | 0 refills | Status: AC

## 2023-12-21 MED ORDER — LIDOCAINE 5 % EX PTCH: 1.0000 | MEDICATED_PATCH | CUTANEOUS | 0 refills | Status: DC

## 2023-12-21 NOTE — ED Triage Notes (Signed)
 PT ambulatory to triage with complaints of low back pain. PT states that on thanksgiving she stopped her granddaughter from going down stairs, which resulted in her sliding down approx 7 steps.   Pt able to walk, but states that her pain did not improve with advil .

## 2023-12-21 NOTE — Discharge Instructions (Signed)
 Please follow-up with your primary doctor.  You can also follow-up with neurosurgery.  Please call to schedule appointment.  Please take Tylenol  alternating with ibuprofen  every 4 hours with dosages as directed on the packaging.  You may take the lidocaine  patches and muscle relaxer for further breakthrough pain.  If pain is still severe you may take Percocet.  Return for fevers, chills, severe pain, weakness in your lower extremities, numbness in your lower extremities or your perineum, bowel or bladder incontinence or unable to urinate or have bowel movement.  We also recommend you develop any new or worsening symptoms that are concerning to you.

## 2023-12-21 NOTE — ED Provider Notes (Signed)
 [facility] AT [facility] Provider Note   CSN: [id number] Arrival date & time: 12/21/23  1605     Patient presents with: Back Pain   Carolyn Summers is a 48 y.o. female.   This is a 48 year old female presenting emergency department with complaint of low back pain.  On Thanksgiving day she  stopped her granddaughter from falling down stairs, but in the process she slipped and slid down the stairs on her backside.  Did not hit her head, no LOC.  Not on blood thinner.  Has a history of back pain, but reports it has been relatively improved since she had spinal surgery in 2021.  Since falling on the stairs she has noted increasing pain to her low back.  Some radiation down her left leg.  No numbness tingling changes in sensation, no bowel or bladder incontinence, no saddle anesthesia.  No fevers or chills.   Back Pain      Prior to Admission medications   Medication Sig Start Date End Date Taking? Authorizing Provider  HYDROcodone -acetaminophen  (NORCO/VICODIN) 5-325 MG tablet Take 1 tablet by mouth every 6 (six) hours as needed for moderate pain. 02/08/20   Barbarann Oneil BROCKS, MD  methocarbamol  (ROBAXIN ) 500 MG tablet Take 1 tablet (500 mg total) by mouth every 6 (six) hours as needed for muscle spasms. Patient not taking: Reported on 02/01/2020 01/17/20   Quin Lynwood HERO, PA-C  methocarbamol  (ROBAXIN ) 500 MG tablet Take 1 tablet (500 mg total) by mouth every 8 (eight) hours as needed for muscle spasms. 02/17/20   Barbarann Oneil BROCKS, MD  oxyCODONE -acetaminophen  (PERCOCET/ROXICET) 5-325 MG tablet Take 1-2 tablets by mouth every 4 (four) hours as needed for severe pain. Patient not taking: Reported on 02/01/2020 01/17/20   Quin Lynwood HERO, PA-C  oxyCODONE -acetaminophen  (PERCOCET/ROXICET) 5-325 MG tablet Take 1 tablet by mouth every 4 (four) hours as needed for severe pain. 01/25/20   Barbarann Oneil BROCKS, MD  traMADol  (ULTRAM ) 50 MG tablet Take 1 tablet (50 mg total) by mouth 2  (two) times daily as needed. 03/07/20   Barbarann Oneil BROCKS, MD    Allergies: Patient has no known allergies.    Review of Systems  Musculoskeletal:  Positive for back pain.    Updated Vital Signs BP (!) 110/56 (BP Location: Left Arm)   Pulse 87   Temp 97.9 F (36.6 C) (Oral)   Resp 18   SpO2 100%   Physical Exam Vitals and nursing note reviewed.  Constitutional:      General: She is not in acute distress.    Appearance: She is not toxic-appearing.  HENT:     Head: Normocephalic and atraumatic.     Nose: Nose normal.     Mouth/Throat:     Mouth: Mucous membranes are moist.  Eyes:     Conjunctiva/sclera: Conjunctivae normal.  Cardiovascular:     Rate and Rhythm: Normal rate.  Pulmonary:     Effort: Pulmonary effort is normal.  Abdominal:     General: Abdomen is flat. There is no distension.     Palpations: Abdomen is soft.     Tenderness: There is no abdominal tenderness. There is no guarding or rebound.  Musculoskeletal:     Comments: 5 out of 5 plantarflexion, dorsiflexion, hip extension.  Able to lift both legs off bed.  Normal sensation in lower extremities.  2+ DP pulses.  Soft compartments.  Skin:    General: Skin is warm and dry.  Capillary Refill: Capillary refill takes less than 2 seconds.  Neurological:     General: No focal deficit present.     Mental Status: She is alert and oriented to person, place, and time.  Psychiatric:        Mood and Affect: Mood normal.        Behavior: Behavior normal.     (all labs ordered are listed, but only abnormal results are displayed) Labs Reviewed  POC URINE PREG, ED    EKG: None  Radiology: CT Lumbar Spine Wo Contrast Result Date: 12/21/2023 EXAM: CT OF THE LUMBAR SPINE WITHOUT CONTRAST 12/21/2023 06:59:29 PM TECHNIQUE: CT of the lumbar spine was performed without the administration of intravenous contrast. Multiplanar reformatted images are provided for review. Automated exposure control, iterative  reconstruction, and/or weight based adjustment of the mA/kV was utilized to reduce the radiation dose to as low as reasonably achievable. COMPARISON: MRI lumbar spine 12/24/2019. CLINICAL HISTORY: Fall down stairs 3 days ago. Persistent pain despite over the counter medications. FINDINGS: BONES AND ALIGNMENT: Normal vertebral body heights. No acute fracture or suspicious bone lesion. Normal alignment. Straightening of the normal cervical lordosis is stable. DEGENERATIVE CHANGES: * **L3-L4:** Mild facet degenerative changes are present. No significant disc protrusion or stenosis is present. * **L4-L5:** Broad based disc protrusion is present. Central canal stenosis is less prominent than on the prior study. Mild foraminal narrowing is present bilaterally. * **L5-S1:** Chronic loss of disc height is present. Left laminectomy is present. No residual or recurrent stenosis. Facet spurring contributes to moderate left foraminal stenosis. Endplate osteophytes contribute to moderate right foraminal stenosis. SOFT TISSUES: No acute abnormality. IMPRESSION: 1. No acute traumatic injury. 2. L4-5 broad-based disc protrusion with mild bilateral foraminal narrowing and improved central canal stenosis compared to prior study. 3. L5-S1 chronic loss of disc height with moderate bilateral foraminal stenosis, left due to facet spurring and right due to endplate osteophytes. Electronically signed by: Lonni Necessary MD 12/21/2023 07:31 PM EST RP Workstation: [id number]     Procedures   Medications Ordered in the ED  lidocaine  (LIDODERM ) 5 % 1 patch (1 patch Transdermal Patch Applied 12/21/23 1722)  acetaminophen  (TYLENOL ) tablet 1,000 mg (1,000 mg Oral Given 12/21/23 1722)  ketorolac  (TORADOL ) 15 MG/ML injection 15 mg (15 mg Intravenous Given 12/21/23 1714)  methocarbamol  (ROBAXIN ) tablet 500 mg (500 mg Oral Given 12/21/23 1722)  oxyCODONE  (Oxy IR/ROXICODONE ) immediate release tablet 5 mg (5 mg Oral Given 12/21/23 1850)   dexamethasone  (DECADRON ) tablet 4 mg (4 mg Oral Given 12/21/23 1850)    Clinical Course as of 12/21/23 2013  Sun Dec 21, 2023  1647 Per chart review had micro discectomy in her lumbar spine in 2021 [TY]  1946 CT Lumbar Spine Wo Contrast IMPRESSION: 1. No acute traumatic injury. 2. L4-5 broad-based disc protrusion with mild bilateral foraminal narrowing and improved central canal stenosis compared to prior study. 3. L5-S1 chronic loss of disc height with moderate bilateral foraminal stenosis, left due to facet spurring and right due to endplate osteophytes.   [TY]  2008 Patient reevaluated.  sHe is feeling some improvement after pain medications and feels that her pain can be managed further at home.  Discussed primary doctor follow-up as well as neurosurgery follow-up.  Will discharge with pain medications. [TY]    Clinical Course User Index [TY] Neysa Caron PARAS, DO  Medical Decision Making 48 year old female presenting emergency department for low back pain.  Afebrile nontachycardic, slightly hypertensive.  Physical exam reassuring.  Did have prior discectomy in 2021 per chart review.  Concern for traumatic injury given patient's reported severe pain.  However CT lumbar spine without acute process.  Treated with multimodal pain medications.  Pain improved.  Discharged in stable condition.  Amount and/or Complexity of Data Reviewed Labs:     Details: Considered labs, however seemingly not isolated orthopedic injury secondary to her fall.  Labs unlikely to be revealatory or relevant to her current complaint. Radiology: ordered and independent interpretation performed. Decision-making details documented in ED Course.    Details: Do not appreciate obvious fracture.  Considered CT head, however did not hit her head, no LOC, no nausea or vomiting, no focal deficits on exam.  Low suspicion for acute intracranial traumatic injury.  Risk OTC  drugs. Prescription drug management. Decision regarding hospitalization. Diagnosis or treatment significantly limited by social determinants of health.       Final diagnoses:  None    ED Discharge Orders     None          Neysa Caron PARAS, DO 12/21/23 2013
